# Patient Record
Sex: Female | Born: 1970
Health system: Southern US, Community
[De-identification: ages and names within clinical notes are randomized; demographics above are authoritative.]

## PROBLEM LIST (undated history)

## (undated) DIAGNOSIS — D219 Benign neoplasm of connective and other soft tissue, unspecified: Secondary | ICD-10-CM

## (undated) DIAGNOSIS — IMO0002 Reserved for concepts with insufficient information to code with codable children: Secondary | ICD-10-CM

## (undated) DIAGNOSIS — N946 Dysmenorrhea, unspecified: Secondary | ICD-10-CM

## (undated) DIAGNOSIS — N920 Excessive and frequent menstruation with regular cycle: Secondary | ICD-10-CM

## (undated) HISTORY — DX: Dysmenorrhea, unspecified: N94.6

## (undated) HISTORY — DX: Benign neoplasm of connective and other soft tissue, unspecified: D21.9

## (undated) HISTORY — DX: Reserved for concepts with insufficient information to code with codable children: IMO0002

## (undated) HISTORY — DX: Excessive and frequent menstruation with regular cycle: N92.0

---

## 2001-05-15 ENCOUNTER — Other Ambulatory Visit: Admission: RE | Admit: 2001-05-15 | Discharge: 2001-05-15 | Payer: Self-pay | Admitting: Obstetrics and Gynecology

## 2001-06-25 ENCOUNTER — Encounter: Admission: RE | Admit: 2001-06-25 | Discharge: 2001-06-25 | Payer: Self-pay | Admitting: Internal Medicine

## 2001-06-25 ENCOUNTER — Encounter: Payer: Self-pay | Admitting: Internal Medicine

## 2002-05-27 ENCOUNTER — Other Ambulatory Visit: Admission: RE | Admit: 2002-05-27 | Discharge: 2002-05-27 | Payer: Self-pay | Admitting: Obstetrics and Gynecology

## 2002-05-27 DIAGNOSIS — IMO0002 Reserved for concepts with insufficient information to code with codable children: Secondary | ICD-10-CM

## 2002-05-27 DIAGNOSIS — R87612 Low grade squamous intraepithelial lesion on cytologic smear of cervix (LGSIL): Secondary | ICD-10-CM

## 2002-05-27 HISTORY — DX: Low grade squamous intraepithelial lesion on cytologic smear of cervix (LGSIL): R87.612

## 2002-05-27 HISTORY — DX: Reserved for concepts with insufficient information to code with codable children: IMO0002

## 2002-11-22 ENCOUNTER — Other Ambulatory Visit: Admission: RE | Admit: 2002-11-22 | Discharge: 2002-11-22 | Payer: Self-pay | Admitting: Obstetrics and Gynecology

## 2003-05-30 ENCOUNTER — Other Ambulatory Visit: Admission: RE | Admit: 2003-05-30 | Discharge: 2003-05-30 | Payer: Self-pay | Admitting: Obstetrics and Gynecology

## 2003-08-19 ENCOUNTER — Other Ambulatory Visit: Admission: RE | Admit: 2003-08-19 | Discharge: 2003-08-19 | Payer: Self-pay | Admitting: Obstetrics and Gynecology

## 2003-12-16 ENCOUNTER — Other Ambulatory Visit: Admission: RE | Admit: 2003-12-16 | Discharge: 2003-12-16 | Payer: Self-pay | Admitting: Obstetrics and Gynecology

## 2005-02-10 ENCOUNTER — Other Ambulatory Visit: Admission: RE | Admit: 2005-02-10 | Discharge: 2005-02-10 | Payer: Self-pay | Admitting: Obstetrics and Gynecology

## 2006-02-13 ENCOUNTER — Other Ambulatory Visit: Admission: RE | Admit: 2006-02-13 | Discharge: 2006-02-13 | Payer: Self-pay | Admitting: Obstetrics and Gynecology

## 2006-07-11 ENCOUNTER — Other Ambulatory Visit: Admission: RE | Admit: 2006-07-11 | Discharge: 2006-07-11 | Payer: Self-pay | Admitting: Obstetrics and Gynecology

## 2007-04-12 ENCOUNTER — Other Ambulatory Visit: Admission: RE | Admit: 2007-04-12 | Discharge: 2007-04-12 | Payer: Self-pay | Admitting: Obstetrics and Gynecology

## 2008-01-18 HISTORY — PX: OTHER SURGICAL HISTORY: SHX169

## 2008-04-29 ENCOUNTER — Other Ambulatory Visit: Admission: RE | Admit: 2008-04-29 | Discharge: 2008-04-29 | Payer: Self-pay | Admitting: Obstetrics and Gynecology

## 2009-01-17 HISTORY — PX: COLPOSCOPY W/ BIOPSY / CURETTAGE: SUR283

## 2009-06-05 LAB — HM PAP SMEAR: HM Pap smear: NORMAL

## 2012-03-31 ENCOUNTER — Encounter: Payer: Self-pay | Admitting: Nurse Practitioner

## 2012-04-18 ENCOUNTER — Ambulatory Visit: Payer: Self-pay | Admitting: Nurse Practitioner

## 2012-04-25 ENCOUNTER — Other Ambulatory Visit: Payer: Self-pay | Admitting: Obstetrics & Gynecology

## 2012-04-25 ENCOUNTER — Other Ambulatory Visit: Payer: Self-pay | Admitting: Nurse Practitioner

## 2012-04-25 ENCOUNTER — Encounter: Payer: Self-pay | Admitting: Nurse Practitioner

## 2012-04-25 ENCOUNTER — Ambulatory Visit (INDEPENDENT_AMBULATORY_CARE_PROVIDER_SITE_OTHER): Payer: BC Managed Care – PPO | Admitting: Nurse Practitioner

## 2012-04-25 VITALS — BP 124/82 | HR 76 | Resp 16 | Ht 65.75 in | Wt 176.0 lb

## 2012-04-25 DIAGNOSIS — D219 Benign neoplasm of connective and other soft tissue, unspecified: Secondary | ICD-10-CM

## 2012-04-25 DIAGNOSIS — Z01419 Encounter for gynecological examination (general) (routine) without abnormal findings: Secondary | ICD-10-CM

## 2012-04-25 DIAGNOSIS — Z0000 Encounter for general adult medical examination without abnormal findings: Secondary | ICD-10-CM

## 2012-04-25 DIAGNOSIS — D62 Acute posthemorrhagic anemia: Secondary | ICD-10-CM

## 2012-04-25 DIAGNOSIS — N63 Unspecified lump in unspecified breast: Secondary | ICD-10-CM

## 2012-04-25 DIAGNOSIS — N6320 Unspecified lump in the left breast, unspecified quadrant: Secondary | ICD-10-CM

## 2012-04-25 DIAGNOSIS — D259 Leiomyoma of uterus, unspecified: Secondary | ICD-10-CM

## 2012-04-25 LAB — POCT URINALYSIS DIPSTICK
Bilirubin, UA: NEGATIVE
Blood, UA: NEGATIVE
Glucose, UA: NEGATIVE
Ketones, UA: NEGATIVE
Leukocytes, UA: NEGATIVE
Nitrite, UA: NEGATIVE
Protein, UA: NEGATIVE

## 2012-04-25 NOTE — Progress Notes (Signed)
42 y.o. G0P0000 Single African American Fe here for annual  Exam.    Menses usually heavy X 2 then light then spotting for 8 days.  No change in diet.  Same partner for 2 yrs. No STD risk factors. Condoms 'some times' for birth control. If pregnancy occurs Ok. History of UTE fibroids and had surgery 2010 by Dr. Hope Pigeon with H' scopic resection.  Patient's last menstrual period was 04/02/2012.          Sexually active: yes  The current method of family planning is condoms sometimes.    Exercising: no  The patient does not participate in regular exercise at present. Smoker:  no  Health Maintenance: Pap:  06/04/09 MMG:  n/a Colonoscopy: n/a BMD:   n/a TDaP:  2009 Labs: Hgb 10.6, urine Normal   reports that she has never smoked. She has never used smokeless tobacco. She reports that she does not drink alcohol or use illicit drugs.  Past Medical History  Diagnosis Date  . Fibroids   . Dysmenorrhea   . Menorrhagia   . Abnormal Pap smear, low grade squamous intraepithelial lesion (LGSIL) 05/27/2002    colpo bx. Chronic cervicitis  . Abnormal Pap smear, low grade squamous intraepithelial lesion (LGSIL) 11/22/2002    no colpo  . Abnormal Pap smear, low grade squamous intraepithelial lesion (LGSIL) 05/30/2003    no colpo    Past Surgical History  Procedure Laterality Date  . Colposcopy w/ biopsy / curettage  08/22/2002    Chroni cervicitis w/o  dysplasia    Current Outpatient Prescriptions  Medication Sig Dispense Refill  . fish oil-omega-3 fatty acids 1000 MG capsule Take 2 g by mouth daily.      . Multiple Vitamin (MULTIVITAMIN WITH MINERALS) TABS Take 1 tablet by mouth daily.      . naproxen sodium (ANAPROX) 220 MG tablet Take 220 mg by mouth as needed.       No current facility-administered medications for this visit.    Family History  Problem Relation Age of Onset  . Diabetes Mother   . Hypertension Mother   . Breast cancer Mother   . Breast cancer Maternal Aunt     late  30's  . Hypertension Father   . Cancer Maternal Grandmother     leukemia  . Heart failure Maternal Grandfather     ROS:  Pertinent items are noted in HPI.  Otherwise, a comprehensive ROS was negative.  Exam:   BP 124/82  Pulse 76  Resp 16  Ht 5' 5.75" (1.67 m)  Wt 176 lb (79.833 kg)  BMI 28.63 kg/m2  LMP 04/02/2012 Height: 5' 5.75" (167 cm)  Ht Readings from Last 3 Encounters:  04/25/12 5' 5.75" (1.67 m)    General appearance: alert, cooperative and appears stated age Head: Normocephalic, without obvious abnormality, atraumatic Neck: no adenopathy, supple, symmetrical, trachea midline and thyroid normal to inspection and palpation Lungs: clear to auscultation bilaterally Breasts: positive findings: 3 cm, smooth, firm, mobile and non-tender nodule located on the left upper outer quadrant at 10:00 position. Patient also rechecked by Dr. Hyacinth Meeker Heart: regular rate and rhythm Abdomen: soft, non-tender; bowel sounds normal; no masses,  no organomegaly Extremities: extremities normal, atraumatic, no cyanosis or edema Skin: Skin color, texture, turgor normal. No rashes or lesions Lymph nodes: Cervical, supraclavicular, and axillary nodes normal. No abnormal inguinal nodes palpated Neurologic: Grossly normal   Pelvic: External genitalia:  no lesions  Urethra:  normal appearing urethra with no masses, tenderness or lesions              Bartholin's and Skene's: normal                 Vagina: normal appearing vagina with normal color and discharge, no lesions              Cervix: anteverted              Pap taken: yes including HR HPV Bimanual Exam:  Uterus:  enlarged, 14-16  weeks size              Adnexa: positive for: enlargement and fullness               Rectovaginal: Confirms               Anus:  normal sphincter tone, no lesions  A:  Well Woman with normal exam  Mass Left Breast at 10:00 position  History of UTE fibroids with H' scopic resection done 2010  by  Dr. Hope Pigeon  Condoms for birth control  P:   Mammogram diagnostic and screening is scheduled  during  this visit  pap smear as per guidelines  Scheduled PUS and Dr. Hyacinth Meeker to see patient and follow  counseled on breast self exam, mammography screening,  adequate intake of calcium and vitamin D, diet and  exercise, Kegel's exercises  additional lab tests per Epic Care orders  return annually or prn  An After Visit Summary was printed and given to the patient.  I personally examined patient after Shirlyn Goltz, NP.  Diagnostic MMG of left breast planned.  Will also have patient complete routine imaging of right breast as well.  Scheduled for 04/30/12.

## 2012-04-25 NOTE — Patient Instructions (Addendum)

## 2012-04-26 ENCOUNTER — Other Ambulatory Visit: Payer: Self-pay | Admitting: Obstetrics & Gynecology

## 2012-04-26 DIAGNOSIS — D219 Benign neoplasm of connective and other soft tissue, unspecified: Secondary | ICD-10-CM

## 2012-04-27 ENCOUNTER — Telehealth: Payer: Self-pay | Admitting: *Deleted

## 2012-04-27 LAB — IPS PAP TEST WITH HPV

## 2012-04-27 LAB — HEMOGLOBIN, FINGERSTICK: Hemoglobin, fingerstick: 10.6 g/dL — ABNORMAL LOW (ref 12.0–16.0)

## 2012-04-27 NOTE — Telephone Encounter (Signed)
PATIENT NOT AT PHONE #'S LISTED IN CHART OR EPIC. LEFT MESSAGE WITH MOTHER AT HOME # OF APPOINTMENT DATE AND TIME WITH THE [facility]. MOTHER STATES WILL GIVE HER THE MESSAGE. SUE

## 2012-05-07 ENCOUNTER — Other Ambulatory Visit: Payer: Self-pay

## 2012-05-08 ENCOUNTER — Ambulatory Visit
Admission: RE | Admit: 2012-05-08 | Discharge: 2012-05-08 | Disposition: A | Discharge status: Home / Self Care | Payer: BC Managed Care – PPO | Source: Ambulatory Visit | Attending: Obstetrics & Gynecology | Admitting: Obstetrics & Gynecology

## 2012-05-08 DIAGNOSIS — N6320 Unspecified lump in the left breast, unspecified quadrant: Secondary | ICD-10-CM

## 2012-05-15 ENCOUNTER — Ambulatory Visit (INDEPENDENT_AMBULATORY_CARE_PROVIDER_SITE_OTHER): Payer: BC Managed Care – PPO

## 2012-05-15 ENCOUNTER — Ambulatory Visit (INDEPENDENT_AMBULATORY_CARE_PROVIDER_SITE_OTHER): Payer: BC Managed Care – PPO | Admitting: Obstetrics & Gynecology

## 2012-05-15 ENCOUNTER — Other Ambulatory Visit: Payer: Self-pay | Admitting: Obstetrics & Gynecology

## 2012-05-15 DIAGNOSIS — N859 Noninflammatory disorder of uterus, unspecified: Secondary | ICD-10-CM

## 2012-05-15 DIAGNOSIS — N858 Other specified noninflammatory disorders of uterus: Secondary | ICD-10-CM

## 2012-05-15 DIAGNOSIS — R188 Other ascites: Secondary | ICD-10-CM

## 2012-05-15 DIAGNOSIS — D259 Leiomyoma of uterus, unspecified: Secondary | ICD-10-CM

## 2012-05-15 DIAGNOSIS — N831 Corpus luteum cyst of ovary, unspecified side: Secondary | ICD-10-CM

## 2012-05-15 DIAGNOSIS — D219 Benign neoplasm of connective and other soft tissue, unspecified: Secondary | ICD-10-CM

## 2012-05-15 DIAGNOSIS — D251 Intramural leiomyoma of uterus: Secondary | ICD-10-CM

## 2012-05-15 DIAGNOSIS — D252 Subserosal leiomyoma of uterus: Secondary | ICD-10-CM

## 2012-05-15 DIAGNOSIS — N852 Hypertrophy of uterus: Secondary | ICD-10-CM

## 2012-05-15 DIAGNOSIS — D250 Submucous leiomyoma of uterus: Secondary | ICD-10-CM

## 2012-05-15 NOTE — Progress Notes (Signed)
42 y.o.Single AA female here for a pelvic ultrasound due to pelvic pressure and history of uterine fibroids/menorrhagia.  Patient's last menstrual period was 04/25/2012.  Sexually active:  yes  Contraception: no method  FINDINGS: UTERUS: 13.5 x 8.7 x 9.9cm.  Much enlarged from prior U/S.  Multiple fibroids noted: 4.9cm, 4.4cm, 3.6cm, 3.6cm, 3.5cm, 3.8cm (intramural, subserosal, and one pedunculated.) EMS: cavity is fluid filled containing probable 29mm submucosal fibroid.  Endometrial thickness if 5.34mm. ADNEXA:   Left ovary 3.0 x 3.2 x 2.5cm   Right ovary 2.9x 2.3 x 2.1cm CUL DE SAC:  D/W pt findings.  Images reviewed.  She is interested in child bearing and is in a good relationship right now.  Although she is not having bleeding issues, which she has had in the past, she would really like to proceed with hysteroscopic fibroid resection and possible myomectomy.  She has seen Dr. April Manson in the past and would like to see him again.  We will make referral for her.  All questions answered.    Assessment:  Large fibroid uterus Plan: Referral to Dr. April Manson for discussion of surgical options. PNV recommended.  ~25 minutes spent with patient >50% of time was in face to face discussion of above.

## 2012-05-17 ENCOUNTER — Encounter: Payer: Self-pay | Admitting: Obstetrics & Gynecology

## 2012-05-17 DIAGNOSIS — D259 Leiomyoma of uterus, unspecified: Secondary | ICD-10-CM | POA: Insufficient documentation

## 2012-05-17 DIAGNOSIS — D219 Benign neoplasm of connective and other soft tissue, unspecified: Secondary | ICD-10-CM | POA: Insufficient documentation

## 2012-05-17 NOTE — Patient Instructions (Signed)
We will call you with the appointment information for Dr. April Manson

## 2012-05-31 ENCOUNTER — Encounter: Payer: Self-pay | Admitting: Obstetrics & Gynecology

## 2012-05-31 ENCOUNTER — Ambulatory Visit (INDEPENDENT_AMBULATORY_CARE_PROVIDER_SITE_OTHER): Payer: BC Managed Care – PPO | Admitting: Obstetrics & Gynecology

## 2012-05-31 VITALS — BP 108/62 | HR 64 | Resp 12

## 2012-05-31 DIAGNOSIS — N6459 Other signs and symptoms in breast: Secondary | ICD-10-CM

## 2012-05-31 DIAGNOSIS — R922 Inconclusive mammogram: Secondary | ICD-10-CM

## 2012-05-31 NOTE — Progress Notes (Signed)
   Subjective:   42 y.o. SingleAfrican American female presents for evaluation of breasts.  Had palpable finding when here for u/s.  Went for screening MMG and ultrasound on left.  Negative findings.  Pt had LMP last week.  No new findings.  Breasts feel less "knotty" to her after cycle.      Objective:   General appearance: alert and cooperative Head: Normocephalic, without obvious abnormality, atraumatic Neck: no adenopathy, supple, symmetrical, trachea midline and thyroid not enlarged, symmetric, no tenderness/mass/nodules Breasts: normal appearance, no masses or tenderness, bilateral UOQ fibrocystic changes.  Much less nodular today.   Assessment:   ASSESSMENT:Patient is diagnosed with normal breast exam and breast density   Plan:   PLAN:  SBE encouraged.  Repeat MMG one year.  Patient does not have appt for AEX but knows due in about a year.

## 2013-01-01 HISTORY — PX: MYOMECTOMY ABDOMINAL APPROACH: SUR870

## 2013-03-11 ENCOUNTER — Encounter: Payer: Self-pay | Admitting: Nurse Practitioner

## 2013-03-11 ENCOUNTER — Ambulatory Visit (INDEPENDENT_AMBULATORY_CARE_PROVIDER_SITE_OTHER): Payer: BC Managed Care – PPO | Admitting: Nurse Practitioner

## 2013-03-11 VITALS — BP 131/97 | HR 83 | Ht 65.75 in | Wt 175.0 lb

## 2013-03-11 DIAGNOSIS — R350 Frequency of micturition: Secondary | ICD-10-CM

## 2013-03-11 LAB — POCT URINALYSIS DIPSTICK
BILIRUBIN UA: NEGATIVE
Blood, UA: NEGATIVE
GLUCOSE UA: NEGATIVE
Ketones, UA: NEGATIVE
LEUKOCYTES UA: NEGATIVE
NITRITE UA: NEGATIVE
Protein, UA: NEGATIVE
Urobilinogen, UA: NEGATIVE
pH, UA: 5

## 2013-03-11 NOTE — Progress Notes (Signed)
Subjective:     Patient ID: Emily Harris, female   DOB: Jun 27, 1970, 43 y.o.   MRN: [id number]  HPI This 43 yo SAA Fe complains of urinary or lower abdominal symptoms for the past few days. Some frequency but no dysuria.  She was concerned because she had a foley catheter after Myomectomy on December 16th.  She has now returned to work X 2 weeks and usual activities which includes wearing a belt and sometimes lifting.  No pain with SA.  No change in partner.  Using condoms for birth control.  Was to wait 3 months before trying for a pregnancy. No vgiinal discharge. LMP 02/17/13 which was normal. Last pm chilled feeling but thought it may be related to the weather.  She is having some fatigue but has only been back on her iron tablets for a week.  She was to return there end of December for a repeat HGB but she did not.  Mostly wants to make sure every thing looks and feels OK.   Review of Systems  Constitutional: Positive for chills and fatigue. Negative for fever.  Respiratory: Negative.   Cardiovascular: Negative.   Gastrointestinal: Negative.  Negative for nausea, vomiting, abdominal pain, diarrhea, constipation and blood in stool.  Genitourinary: Positive for frequency. Negative for dysuria, urgency, hematuria, flank pain, vaginal bleeding, vaginal discharge, vaginal pain, menstrual problem, pelvic pain and dyspareunia.  Musculoskeletal: Negative.   Skin: Negative.   Neurological: Negative.   Psychiatric/Behavioral: Negative.        Objective:   Physical Exam  Constitutional: She is oriented to person, place, and time. She appears well-developed and well-nourished. No distress.  Abdominal: Soft. She exhibits no distension and no mass. There is no tenderness. There is no rebound and no guarding.  Lower abdominal incision is well healed. No tenderness over the bladder.  No flank pain.  Genitourinary:  Normal vaginal discharge without any signs of infection. On bimanual no pain and so much  better without enlargement of the uterus. No cervical pain. Urine chemstrip is negative.  Neurological: She is alert and oriented to person, place, and time.  Psychiatric: She has a normal mood and affect. Her behavior is normal. Judgment and thought content normal.       Assessment:     History of Myomectomy with removal of 15 fibroids 01/01/13 History of anemia - back on Fe X 1 week Urinary frequency - R/O UTI    Plan:     No treatment indicated at this time.  Will send urine for C & S and follow. She is aware if symptoms worsen to call back. She will have HGB recheck in 1 month here ar at infertility clinic

## 2013-03-11 NOTE — Patient Instructions (Signed)
We will call with urine culture results Get repeat HGB in a month

## 2013-03-12 LAB — URINE CULTURE
COLONY COUNT: NO GROWTH
ORGANISM ID, BACTERIA: NO GROWTH

## 2013-03-14 NOTE — Progress Notes (Signed)
Encounter reviewed by Dr. Brook Silva.  

## 2013-03-18 ENCOUNTER — Telehealth: Payer: Self-pay | Admitting: *Deleted

## 2013-03-18 NOTE — Telephone Encounter (Signed)
Patient is calling Stephanie back

## 2013-03-18 NOTE — Telephone Encounter (Signed)
I have attempted to contact this patient by phone with the following results: left message to return my call on answering machine (home/mobile).  

## 2013-03-18 NOTE — Telephone Encounter (Signed)
Pt notified in result note.  

## 2013-03-18 NOTE — Telephone Encounter (Signed)
Message copied by Graylon Good on Mon Mar 18, 2013 11:11 AM ------      Message from: Regina Eck      Created: Wed Mar 13, 2013 10:19 PM       Notify urine culture negative, no treatment indicated ------

## 2013-05-02 ENCOUNTER — Other Ambulatory Visit: Payer: Self-pay

## 2013-05-02 DIAGNOSIS — Z1231 Encounter for screening mammogram for malignant neoplasm of breast: Secondary | ICD-10-CM

## 2013-05-21 ENCOUNTER — Ambulatory Visit
Admission: RE | Admit: 2013-05-21 | Discharge: 2013-05-21 | Disposition: A | Discharge status: Home / Self Care | Payer: BC Managed Care – PPO | Source: Ambulatory Visit

## 2013-05-21 DIAGNOSIS — Z1231 Encounter for screening mammogram for malignant neoplasm of breast: Secondary | ICD-10-CM

## 2013-10-28 ENCOUNTER — Encounter: Payer: Self-pay | Admitting: Nurse Practitioner

## 2013-10-28 ENCOUNTER — Ambulatory Visit (INDEPENDENT_AMBULATORY_CARE_PROVIDER_SITE_OTHER): Payer: BC Managed Care – PPO | Admitting: Nurse Practitioner

## 2013-10-28 VITALS — BP 122/82 | HR 72 | Ht 65.75 in | Wt 177.0 lb

## 2013-10-28 DIAGNOSIS — Z0000 Encounter for general adult medical examination without abnormal findings: Secondary | ICD-10-CM

## 2013-10-28 DIAGNOSIS — Z01419 Encounter for gynecological examination (general) (routine) without abnormal findings: Secondary | ICD-10-CM

## 2013-10-28 LAB — POCT URINALYSIS DIPSTICK
Bilirubin, UA: NEGATIVE
Blood, UA: NEGATIVE
Glucose, UA: NEGATIVE
Ketones, UA: NEGATIVE
LEUKOCYTES UA: NEGATIVE
NITRITE UA: NEGATIVE
PH UA: 7
PROTEIN UA: NEGATIVE
UROBILINOGEN UA: NEGATIVE

## 2013-10-28 LAB — HEMOGLOBIN, FINGERSTICK: Hemoglobin, fingerstick: 13.3 g/dL (ref 12.0–16.0)

## 2013-10-28 NOTE — Patient Instructions (Signed)

## 2013-10-28 NOTE — Progress Notes (Signed)
43 y.o. G0P0 Single African American Fe here for annual exam.  Normal menses last for 7 days, flow for 3 days and spotting afterwards.  Ute fibroids removed 2014.  A maternal 1st cousin diagnosed with breast cancer age 65. She was BRCA negative.   Patient's last menstrual period was 10/08/2013.          Sexually active: No.  The current method of family planning is none.    Exercising: Yes.    Home exercise routine includes walking 2-4 miles a week. Smoker:  no  Health Maintenance: Pap:  04/25/12 neg HR HPV MMG:  05/21/13 Bi-Rads Neg TDaP:  2009 Labs: Hgb: 13.3  Urine : negative   reports that she has never smoked. She has never used smokeless tobacco. She reports that she drinks alcohol. She reports that she does not use illicit drugs.  Past Medical History  Diagnosis Date  . Fibroids   . Dysmenorrhea   . Menorrhagia   . Abnormal Pap smear, low grade squamous intraepithelial lesion (LGSIL) 05/27/2002    colpo bx. Chronic cervicitis  . Abnormal Pap smear, low grade squamous intraepithelial lesion (LGSIL) 11/22/2002    no colpo  . Abnormal Pap smear, low grade squamous intraepithelial lesion (LGSIL) 05/30/2003    no colpo    Past Surgical History  Procedure Laterality Date  . Colposcopy w/ biopsy / curettage  08/22/2002    Chroni cervicitis w/o  dysplasia  . Myomectomy abdominal approach  01/01/13    [facility]    Current Outpatient Prescriptions  Medication Sig Dispense Refill  . ferrous sulfate 325 (65 FE) MG tablet Take 325 mg by mouth daily with breakfast.      . fish oil-omega-3 fatty acids 1000 MG capsule Take 2 g by mouth daily.      . Multiple Vitamin (MULTIVITAMIN WITH MINERALS) TABS Take 1 tablet by mouth daily.      . naproxen sodium (ANAPROX) 220 MG tablet Take 220 mg by mouth as needed.       No current facility-administered medications for this visit.    Family History  Problem Relation Age of Onset  . Diabetes Mother   . Hypertension Mother   .  Breast cancer Mother   . Breast cancer Maternal Aunt     late 30's  . Hypertension Father   . Cancer Maternal Grandmother     leukemia  . Heart failure Maternal Grandfather   . Breast cancer Cousin 44    07/2013    ROS:  Pertinent items are noted in HPI.  Otherwise, a comprehensive ROS was negative.  Exam:   BP 122/82  Pulse 72  Ht 5' 5.75" (1.67 m)  Wt 177 lb (80.287 kg)  BMI 28.79 kg/m2  LMP 10/08/2013 Height: 5' 5.75" (167 cm)  Ht Readings from Last 3 Encounters:  10/28/13 5' 5.75" (1.67 m)  03/11/13 5' 5.75" (1.67 m)  04/25/12 5' 5.75" (1.67 m)    General appearance: alert, cooperative and appears stated age Head: Normocephalic, without obvious abnormality, atraumatic Neck: no adenopathy, supple, symmetrical, trachea midline and thyroid normal to inspection and palpation Lungs: clear to auscultation bilaterally Breasts: normal appearance, no masses or tenderness Heart: regular rate and rhythm Abdomen: soft, non-tender; no masses,  no organomegaly Extremities: extremities normal, atraumatic, no cyanosis or edema Skin: Skin color, texture, turgor normal. No rashes or lesions Lymph nodes: Cervical, supraclavicular, and axillary nodes normal. No abnormal inguinal nodes palpated Neurologic: Grossly normal   Pelvic: External genitalia:  no lesions              Urethra:  normal appearing urethra with no masses, tenderness or lesions              Bartholin's and Skene's: normal                 Vagina: normal appearing vagina with normal color and discharge, no lesions              Cervix: anteverted              Pap taken: Yes.   Bimanual Exam:  Uterus:  normal size, contour, position, consistency, mobility, non-tender              Adnexa: no mass, fullness, tenderness               Rectovaginal: Confirms               Anus:  normal sphincter tone, no lesions  A:  Well Woman with normal exam  History of FCB  History of UTE fibroids with H' scopic resection done 2010 by  Dr. Jules Husbands   Condoms for birth control if SA  Maternal 1st cousin with breast cancer age 12, mother breast cancer ? 50's  History of anemia  BRCA negative  Remote history of CIN I 08/2002  P:   Reviewed health and wellness pertinent to exam  Pap smear taken today  Mammogram is due 5/16  Continue FE.  Counseled on breast self exam, mammography screening, adequate intake of calcium and vitamin D, diet and exercise, Kegel's exercises return annually or prn  An After Visit Summary was printed and given to the patient.

## 2013-10-29 NOTE — Progress Notes (Signed)
Encounter reviewed by Dr. Brook Silva.  

## 2013-10-30 LAB — IPS PAP TEST WITH HPV

## 2013-11-07 ENCOUNTER — Telehealth: Payer: Self-pay | Admitting: Emergency Medicine

## 2013-11-07 DIAGNOSIS — R87612 Low grade squamous intraepithelial lesion on cytologic smear of cervix (LGSIL): Secondary | ICD-10-CM

## 2013-11-07 NOTE — Telephone Encounter (Signed)
PR: $30 ° ° °

## 2013-11-07 NOTE — Telephone Encounter (Signed)
Message copied by Michele Mcalpine on Thu Nov 07, 2013  8:51 AM ------      Message from: Kem Boroughs R      Created: Wed Oct 30, 2013  8:36 PM       This patient needs to have a colpo biopsy - previously done in 2004, 2005 ------

## 2013-11-07 NOTE — Telephone Encounter (Signed)
Call to patient. Advised of $30 copay due at colpo visit. Patient agreeable.

## 2013-11-07 NOTE — Telephone Encounter (Signed)
Patient notified of message from Milford Cage, [place].  She is agreeable to scheduling colposcopy. Brief description of procedure given to patient.  Colposcopy pre-procedure instructions given. Advised 800 mg of Motrin with food one hour prior to appointment. Motrin=Advil=Ibuprofen Can take 800 mg (Can purchase over the counter, you will need four 200 mg pills). Make sure to eat a meal before appointment and drink plenty of fluids. Patient verbalized understanding and will call to reschedule if will be on menses or has any concerns regarding pregnancy. Advised will need to cancel within 24 hours or will have $100.00 no show fee placed to account.  Type of birth control-condoms LMP 11/01/13  Patient anxious to schedule. Would like to complete after this cycle, if possible.  Ordered colposcopy, prior colposcopy not done at [facility].   Advised patient would be called with  insurance pre certification. Patient agreeable.   Routing to Dr. Sabra Heck.          [place] for insurance pre certification and link order.

## 2013-11-11 ENCOUNTER — Ambulatory Visit (INDEPENDENT_AMBULATORY_CARE_PROVIDER_SITE_OTHER): Payer: BC Managed Care – PPO | Admitting: Obstetrics & Gynecology

## 2013-11-11 VITALS — BP 124/78 | HR 72 | Resp 16 | Wt 178.6 lb

## 2013-11-11 DIAGNOSIS — R87612 Low grade squamous intraepithelial lesion on cytologic smear of cervix (LGSIL): Secondary | ICD-10-CM

## 2013-11-11 NOTE — Progress Notes (Deleted)
Speculum placed.  3% acetic acid applied to cervix for >45 seconds.  Cervix visualized with both 7.5X and 15X magnification.  Green filter also used.  Lugols solution {WAS/WAS NOT:2100118327::"was not"} used.  Findings:  ***.  Biopsy:  ***.  ECC:  {WAS/WAS NOT:2100118327::"was not"} performed.  Monsel's {WAS/WAS NOT:2100118327::"was not"} needed.  Excellent hemostasis was present.  Pt tolerated procedure well and all instruments were removed.  Findings noted above on picture of cervix.  

## 2013-11-11 NOTE — Progress Notes (Addendum)
Subjective:     Patient ID: Emily Harris, female   DOB: 10-11-1970, 43 y.o.   MRN: [id number]  HPI 43 yo G0 SAAF here for colposcopy due to LGSIL pap with +HR HPV.  Pt had normal pap with neg HR HPV 1 year ago.  Pt understands HPV as had this in the past.  Testing did normalize.  All questions answered.    Review of Systems  All other systems reviewed and are negative.      Objective:   Physical Exam  Constitutional: She appears well-developed and well-nourished.  Genitourinary: Vagina normal. There is no rash, tenderness or lesion on the right labia. There is no rash, tenderness or lesion on the left labia.    Skin: Skin is warm and dry.  Psychiatric: She has a normal mood and affect.   Speculum placed.  3% acetic acid applied to cervix for >45 seconds.  Cervix visualized with both 7.5X and 15X magnification.  Green filter also used.  Lugols solution was used.  Findings:  No AWE noted.  No abnormal staining with Lugol's.  Biopsy:  None taken.  ECC:  was performed.  Monsel's was not needed.  Excellent hemostasis was present.  Pt tolerated procedure well and all instruments were removed.  Findings noted above on picture of cervix.     Assessment:     LGSIL pap with +HR HPV     Plan:     ECC pending.  If normal, repeat Pap and HR HPV 1 year.

## 2013-11-14 LAB — IPS OTHER TISSUE BIOPSY

## 2013-11-19 ENCOUNTER — Telehealth: Payer: Self-pay

## 2013-11-19 NOTE — Telephone Encounter (Signed)
-----   Message from Lyman Speller, MD sent at 11/14/2013 12:32 PM EDT ----- Inform ECC showed HPV changes only.  No dysplasia.  Repeat pap with HR HPV 1 year.  08 recall.

## 2013-11-19 NOTE — Telephone Encounter (Signed)
Spoke with patient. Advised of results as seen below from Dr.Miller. Patient is agreeable and verbalizes understanding. 08 recall entered.  Routing to provider for final review. Patient agreeable to disposition. Will close encounter.      

## 2014-03-24 ENCOUNTER — Ambulatory Visit (INDEPENDENT_AMBULATORY_CARE_PROVIDER_SITE_OTHER): Payer: BLUE CROSS/BLUE SHIELD | Admitting: Family Medicine

## 2014-03-24 VITALS — BP 120/90 | HR 79 | Temp 97.5°F | Resp 16 | Ht 65.5 in | Wt 183.0 lb

## 2014-03-24 DIAGNOSIS — J320 Chronic maxillary sinusitis: Secondary | ICD-10-CM

## 2014-03-24 DIAGNOSIS — R05 Cough: Secondary | ICD-10-CM

## 2014-03-24 DIAGNOSIS — J069 Acute upper respiratory infection, unspecified: Secondary | ICD-10-CM

## 2014-03-24 DIAGNOSIS — R059 Cough, unspecified: Secondary | ICD-10-CM

## 2014-03-24 MED ORDER — BENZONATATE 100 MG PO CAPS: 100.0000 mg | ORAL_CAPSULE | Freq: Three times a day (TID) | ORAL | Status: DC | PRN

## 2014-03-24 MED ORDER — FLUTICASONE PROPIONATE 50 MCG/ACT NA SUSP: NASAL | Status: DC

## 2014-03-24 MED ORDER — AMOXICILLIN 875 MG PO TABS: 875.0000 mg | ORAL_TABLET | Freq: Two times a day (BID) | ORAL | Status: DC

## 2014-03-24 NOTE — Progress Notes (Signed)
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 She has had a respiratory tract infection for the past 8 days. She's not been running a definite fever, though may have had one 1 night. She has had a lot of head congestion. She is blowing out deep yellow mucus. She has minimal throat irritation. No ear problems. No chest pains but she does have a cough. She does not smoke. She has no children. She has tried treating herself with some OTC medications without a lot of relief.  Objective: TMs are normal. Mild tenderness over maxillary sinuses. Throat clear. Neck supple without significant nodes. Chest is clear to auscultation. Heart regular without murmurs.  Assessment: Upper respiratory infection with prolonged sinus congestion  Plan: Fluticasone Amoxicillin  Return if not improving. No testing done today.

## 2014-03-24 NOTE — Patient Instructions (Addendum)
Drink plenty of fluids. The better hydrated you stay the thinner the secretions.  Make sure you get enough rest. That helps the body's immune system be things off.  Take amoxicillin one twice daily for infection  Use the fluticasone nose spray 2 sprays in each nostril twice daily for 3 days, then once daily  You can continue taking an over-the-counter decongestant.  Take the cough pills one or 2 pills 3 times daily as needed for cough  Return if not improving

## 2014-04-09 ENCOUNTER — Ambulatory Visit (INDEPENDENT_AMBULATORY_CARE_PROVIDER_SITE_OTHER): Payer: BLUE CROSS/BLUE SHIELD | Admitting: Family Medicine

## 2014-04-09 VITALS — BP 132/78 | HR 81 | Temp 98.2°F | Resp 18 | Ht 65.0 in | Wt 185.0 lb

## 2014-04-09 DIAGNOSIS — J069 Acute upper respiratory infection, unspecified: Secondary | ICD-10-CM

## 2014-04-09 DIAGNOSIS — R05 Cough: Secondary | ICD-10-CM

## 2014-04-09 DIAGNOSIS — R059 Cough, unspecified: Secondary | ICD-10-CM

## 2014-04-09 MED ORDER — HYDROCODONE-HOMATROPINE 5-1.5 MG/5ML PO SYRP
5.0000 mL | ORAL_SOLUTION | Freq: Every evening | ORAL | Status: DC | PRN
Start: 2014-04-09 — End: 2014-05-20

## 2014-04-09 MED ORDER — AZITHROMYCIN 250 MG PO TABS: ORAL_TABLET | ORAL | Status: DC

## 2014-04-09 MED ORDER — BENZONATATE 100 MG PO CAPS: 100.0000 mg | ORAL_CAPSULE | Freq: Three times a day (TID) | ORAL | Status: DC | PRN

## 2014-04-09 NOTE — Progress Notes (Signed)
Chief Complaint:  Chief Complaint  Patient presents with  . Follow-up    still having cough and congestion    HPI: Emily Harris is a 44 y.o. female who is here for  Acute respiratory sxs. Since feb cough  And congestion, has to stay up , mucus. She denies allergies.  She states that her symptoms have been present since the end of February. She was seen for an upper respiratory infection and was given amoxicillin, Tessalon Perles and advised to take Flonase. She took the antibiotics and it did help the symptoms. However its it has returned. She did not take the Flonase except for one time. She denies any allergies, asthma symptoms. 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 She has a dry, occasional productive white mucus cough, the cough keeps her up at night and she has to sit up to feel less congestion says she can actually sleep. She denies any congestive heart failure, weight gain or lower extremity edema. Denies fevers or chills.  Past Medical History  Diagnosis Date  . Fibroids   . Dysmenorrhea   . Menorrhagia   . Abnormal Pap smear, low grade squamous intraepithelial lesion (LGSIL) 05/27/2002    colpo bx. Chronic cervicitis  . Abnormal Pap smear, low grade squamous intraepithelial lesion (LGSIL) 11/22/2002    no colpo  . Abnormal Pap smear, low grade squamous intraepithelial lesion (LGSIL) 05/30/2003    no colpo  . Anemia    Past Surgical History  Procedure Laterality Date  . Colposcopy w/ biopsy / curettage  08/22/2002    Chroni cervicitis w/o  dysplasia  . Myomectomy abdominal approach  01/01/13    [facility]   History   Social History  . Marital Status: Single    Spouse Name: N/A  . Number of Children: N/A  . Years of Education: 16   Occupational History  . medical processor    Social History Main Topics  . Smoking status: Never Smoker   . Smokeless tobacco: Never Used  . Alcohol Use: Yes   Comment: socially  . Drug Use: No  . Sexual Activity: No   Other Topics Concern  . None   Social History Narrative   Family History  Problem Relation Age of Onset  . Diabetes Mother   . Hypertension Mother   . Breast cancer Mother   . Cancer Mother   . Breast cancer Maternal Aunt     late 30's  . Hypertension Father   . Cancer Maternal Grandmother     leukemia  . Diabetes Maternal Grandmother   . Heart failure Maternal Grandfather   . Heart disease Maternal Grandfather   . Breast cancer Cousin 44    07/2013  . Heart disease Cousin   . Diabetes Paternal Grandmother    No Known Allergies Prior to Admission medications   Medication Sig Start Date End Date Taking? Authorizing Provider  ferrous sulfate 325 (65 FE) MG tablet Take 325 mg by mouth daily with breakfast.   Yes Historical Provider, MD  fish oil-omega-3 fatty acids 1000 MG capsule Take 2 g by mouth daily.   Yes Historical Provider, MD  fluticasone (FLONASE) 50 MCG/ACT nasal spray Use 2 sprays each nostril twice daily for 3 days, then once daily 03/24/14  Yes Posey Boyer, MD  Multiple Vitamin (MULTIVITAMIN WITH MINERALS) TABS Take 1 tablet by mouth daily.   Yes Historical Provider, MD  naproxen sodium (ANAPROX) 220 MG tablet Take 220 mg by mouth as needed.   Yes Historical Provider, MD  amoxicillin (AMOXIL) 875 MG tablet Take 1 tablet (875 mg total) by mouth 2 (two) times daily. Patient not taking: Reported on 04/09/2014 03/24/14   Posey Boyer, MD  benzonatate (TESSALON) 100 MG capsule Take 1-2 capsules (100-200 mg total) by mouth 3 (three) times daily as needed. Patient not taking: Reported on 04/09/2014 03/24/14   Posey Boyer, MD     ROS: The patient denies fevers, chills, night sweats, unintentional weight loss, chest pain, palpitations, wheezing, dyspnea on exertion, nausea, vomiting, abdominal pain, dysuria, hematuria, melena, numbness, weakness, or tingling.   All other systems have been reviewed and were  otherwise negative with the exception of those mentioned in the HPI and as above.    PHYSICAL EXAM: Filed Vitals:   04/09/14 1654  BP: 132/78  Pulse: 81  Temp: 98.2 F (36.8 C)  Resp: 18   Filed Vitals:   04/09/14 1654  Height: 5\' 5"  (1.651 m)  Weight: 185 lb (83.915 kg)   Body mass index is 30.79 kg/(m^2).  General: Alert, no acute distress HEENT:  Normocephalic, atraumatic, oropharynx patent. EOMI, PERRLA Erythematous throat, no exudates, TM normal, +/- sinus tenderness, + erythematous/boggy nasal mucosa Cardiovascular:  Regular rate and rhythm, no rubs murmurs or gallops.  No Carotid bruits, radial pulse intact. No pedal edema.  Respiratory: Clear to auscultation bilaterally.  No wheezes, rales, or rhonchi.  No cyanosis, no use of accessory musculature GI: No organomegaly, abdomen is soft and non-tender, positive bowel sounds.  No masses. Skin: No rashes. Neurologic: Facial musculature symmetric. Psychiatric: Patient is appropriate throughout our interaction. Lymphatic: No cervical lymphadenopathy Musculoskeletal: Gait intact.   LABS: Results for orders placed or performed in visit on 11/11/13  Tissue Biopsy  Result Value Ref Range   COMMENTS: [facility]      EKG/XRAY:   Primary read interpreted by Dr. Marin Comment at [facility].   ASSESSMENT/PLAN: Encounter Diagnoses  Name Primary?  . Cough Yes  . Acute upper respiratory infection    I discussed with the patient this is probably related to allergic rhinitis, seasonal allergies. She is very resistant to the idea that this could be allergies. Recommended that she try over-the-counter Nasacort, antihistamine such as Zyrtec first. She can also take the [facility], Hycodan when necessary If the symptoms are not relieved and she can try the azithromycin. Discussed the risk and benefits his abusing antibiotics. Follow-up as needed.  Gross sideeffects, risk and benefits, and alternatives of medications d/w  patient. Patient is aware that all medications have potential sideeffects and we are unable to predict every sideeffect or drug-drug interaction that may occur.  LE, [place], DO 04/09/2014 5:21 PM

## 2014-04-09 NOTE — Patient Instructions (Signed)
Try nasacort nasal spray once a day in the morning Try zyrtec over the counter daily IF no improvement then take the antibiotic  Cough medicines as needed

## 2014-04-21 ENCOUNTER — Other Ambulatory Visit: Payer: Self-pay

## 2014-04-21 DIAGNOSIS — Z1231 Encounter for screening mammogram for malignant neoplasm of breast: Secondary | ICD-10-CM

## 2014-05-16 ENCOUNTER — Ambulatory Visit: Payer: BC Managed Care – PPO | Admitting: Obstetrics & Gynecology

## 2014-05-20 ENCOUNTER — Ambulatory Visit (INDEPENDENT_AMBULATORY_CARE_PROVIDER_SITE_OTHER): Payer: BLUE CROSS/BLUE SHIELD | Admitting: Obstetrics & Gynecology

## 2014-05-20 VITALS — BP 124/80 | HR 60 | Resp 16 | Wt 182.4 lb

## 2014-05-20 DIAGNOSIS — R896 Abnormal cytological findings in specimens from other organs, systems and tissues: Secondary | ICD-10-CM

## 2014-05-20 DIAGNOSIS — IMO0002 Reserved for concepts with insufficient information to code with codable children: Secondary | ICD-10-CM

## 2014-05-20 NOTE — Progress Notes (Signed)
Subjective:     Patient ID: Emily Harris, female   DOB: 09-03-1970, 44 y.o.   MRN: [id number]  HPI 44 yo G0 SAAF here for repeat Pap due to h/o LGSIL and + HR HPV.  Pt's first abnormal pap was in 2014 with neg pap but positive HR HPV.  Pt has prior hx of HPV infection as well.  Still, has several questions today.  These are similar to what we discussed when she had her colposcopy 11/11/13.  Addressed all questions today before exam.    Pt aware, per guidelines, it is ok to skip this Pap and repeat at one year mark.  Anxious about waiting that long so wants to proceed today as we discussed at colposcopy.  Denies any new gyn complaints--no irregular bleeding, vaginal discharge, pelvic pain.  Review of Systems  All other systems reviewed and are negative.      Objective:   Physical Exam  Constitutional: She appears well-developed and well-nourished.  Genitourinary: Vagina normal and uterus normal. There is no rash, tenderness, lesion or injury on the right labia. There is no rash, tenderness, lesion or injury on the left labia. Cervix exhibits no motion tenderness and no discharge. No tenderness or bleeding in the vagina. No vaginal discharge found.  Pap only obtained today.  Lymphadenopathy:       Right: No inguinal adenopathy present.       Left: No inguinal adenopathy present.  Vitals reviewed.      Assessment:     H/O LGSIL pap, s/p colposcopy 10/15     Plan:     Pap only obtained today. HPV questoins answered.   ~15 minutes spent with patient >50% of time was in face to face discussion of HPV, cervical cancer risk, HPV suppression and additional HPV related questions.

## 2014-05-22 LAB — IPS PAP SMEAR ONLY

## 2014-05-23 ENCOUNTER — Ambulatory Visit: Payer: Self-pay

## 2014-05-25 ENCOUNTER — Encounter: Payer: Self-pay | Admitting: Obstetrics & Gynecology

## 2014-05-25 DIAGNOSIS — IMO0002 Reserved for concepts with insufficient information to code with codable children: Secondary | ICD-10-CM | POA: Insufficient documentation

## 2014-05-26 ENCOUNTER — Telehealth: Payer: Self-pay | Admitting: Emergency Medicine

## 2014-05-26 ENCOUNTER — Ambulatory Visit
Admission: RE | Admit: 2014-05-26 | Discharge: 2014-05-26 | Disposition: A | Discharge status: Home / Self Care | Payer: BLUE CROSS/BLUE SHIELD | Source: Ambulatory Visit

## 2014-05-26 DIAGNOSIS — R87613 High grade squamous intraepithelial lesion on cytologic smear of cervix (HGSIL): Secondary | ICD-10-CM

## 2014-05-26 DIAGNOSIS — Z1231 Encounter for screening mammogram for malignant neoplasm of breast: Secondary | ICD-10-CM

## 2014-05-26 NOTE — Telephone Encounter (Signed)
-----   Message from Megan Salon, MD sent at 05/23/2014  8:09 AM EDT ----- Inform pt Pap showed HGSIL, so as I informed her when she was here this week, she does need a repeat colposcopy.

## 2014-05-26 NOTE — Addendum Note (Signed)
Addended by: Michele Mcalpine on: 05/26/2014 04:36 PM   Modules accepted: Orders

## 2014-05-26 NOTE — Telephone Encounter (Addendum)
Spoke with patient. Advised pap smear continues to show abnormal cells and colposcopy recommended per Dr. Sabra Heck. Patient agreeable to this. She remembers procedure from last year, has no questions.   LMP 05/24/14, 7 days menses. Patient scheduled for procedure 06/05/14 (cycle day 13) patient states she is not currently sexually active. Advised will schedule for this date and request Dr. Sabra Heck review and will call back if needs to reschedule procedure appointment. Patient agreeable.   Colposcopy pre-procedure instructions given. Make sure to eat a meal and hydrate before appointment.  Advised 800 mg of Motrin with food one hour prior to appointment. Motrin/Advil or Ibuprofen. Take 800 mg (Can purchase over the counter, you will need four 200 mg pills). Patient states she prefers to take Aleve, advised to follow directions on the bottle of her pills she keeps at home.  Advised will need to cancel or reschedule within 72 hours or will have $150.00 no show fee placed to account.   Patient verbalized understanding of preprocedure instructions and cancellation policy and will call to reschedule if will be on menses or has any concerns regarding pregnancy.  Patient is advised she will be contacted with insurance coverage information.           [place] for insurance pre certification.    Dr. Sabra Heck okay as scheduled?  (Encounter closed in erroneously prior to sending message)

## 2014-05-27 NOTE — Telephone Encounter (Signed)
Pre-cert complete

## 2014-06-05 ENCOUNTER — Ambulatory Visit (INDEPENDENT_AMBULATORY_CARE_PROVIDER_SITE_OTHER): Payer: BLUE CROSS/BLUE SHIELD | Admitting: Obstetrics & Gynecology

## 2014-06-05 ENCOUNTER — Encounter: Payer: Self-pay | Admitting: Obstetrics & Gynecology

## 2014-06-05 DIAGNOSIS — R87613 High grade squamous intraepithelial lesion on cytologic smear of cervix (HGSIL): Secondary | ICD-10-CM

## 2014-06-05 NOTE — Progress Notes (Signed)
44 y.o. Single African American female here for colposcopy with possible biopsies and/or ECC due to HGSIL Pap obtained at [facility] 05/20/14.    Patient's last menstrual period was 05/23/2014 (exact date).          Sexually active: No.  The current method of family planning is none.     Patient has been counseled about results and procedure.  Risks and benefits have bene reviewed including immediate and/or delayed bleeding, infection, cervical scaring from procedure, possibility of needing additional follow up as well as treatment.  rare risks of missing a lesion discussed as well.  All questions answered.  Pt ready to proceed.  BP 132/82 mmHg  Pulse 72  Resp 16  Wt 182 lb 3.2 oz (82.645 kg)  LMP 05/23/2014 (Exact Date)  General appearance: alert, cooperative and appears stated age Head: Normocephalic, without obvious abnormality, atraumatic Neurologic: Grossly normal  Pelvic: External genitalia:  no lesions              Urethra:  normal appearing urethra with no masses, tenderness or lesions              Bartholins and Skenes: normal                 Vagina: normal appearing vagina with normal color and discharge, no lesions              Cervix: no lesions              Pap taken: No.  Speculum placed.  3% acetic acid applied to cervix for >45 seconds.  Cervix visualized with both 7.5X and 15X magnification.  Green filter also used.  Lugols solution was used.  Findings:  Tiny area of decreased staining at 4 o'clock.  No AWE, no mosaicism.   Cervix looks very normal.  Endocervical TZ noted.  Biopsy:  4 o'clock.  ECC:  was performed.  Monsel's was needed.  Excellent hemostasis was present.  Pt tolerated procedure well and all instruments were removed.    Assessment:  HGSIL pap  Plan:  Pathology results will be called to patient and follow-up planned pending results.

## 2014-06-10 NOTE — Addendum Note (Signed)
Addended by: Megan Salon on: 06/10/2014 06:07 AM   Modules accepted: Orders

## 2014-06-12 LAB — IPS OTHER TISSUE BIOPSY

## 2014-06-25 ENCOUNTER — Telehealth: Payer: Self-pay | Admitting: Emergency Medicine

## 2014-06-25 DIAGNOSIS — N879 Dysplasia of cervix uteri, unspecified: Secondary | ICD-10-CM

## 2014-06-25 NOTE — Telephone Encounter (Signed)
Message left to return call to Roben Tatsch at [phone].    

## 2014-06-25 NOTE — Telephone Encounter (Signed)
-----   Message from Megan Salon, MD sent at 06/24/2014 12:49 PM EDT ----- Please inform biopsy showed HPV changes but ECC showed dysplasia.  LEEP recommended.  CC:  Maryann Conners

## 2014-06-25 NOTE — Telephone Encounter (Signed)
Noted message as below. Will close encounter.

## 2014-06-25 NOTE — Telephone Encounter (Signed)
Incoming call from patient.   Patient notified of message from Dr. Sabra Heck.  She is agreeable to scheduling LEEP. Brief description of procedure given to patient.  LEEP pre-procedure instructions given. Advised 800 mg of Motrin with food one hour prior to appointment. Motrin=Advil=Ibuprofen Can take 800 mg (Can purchase over the counter, you will need four 200 mg pills). Patient prefers Aleve and will take that.  Make sure to eat a meal before appointment and drink plenty of fluids. Patient verbalized understanding and will call to reschedule if will be on menses or has any concerns regarding pregnancy. Advised will need to cancel within 24 hours or will have $100.00 no show fee placed to account.   LMP 6/2 or 6/3 per patient. Patient states she is not sexually active at all.   Dr. Sabra Heck, okay for LEEP scheduled for day possible cycle day 12. Patient confirms she is not sexually active.   cc Felipa Emory

## 2014-06-25 NOTE — Telephone Encounter (Signed)
Yes.  Pt has also told me the same thing regarding sexual activity so ok to schedule whenever it works for her schedule.  Thanks.

## 2014-06-30 ENCOUNTER — Encounter: Payer: Self-pay | Admitting: Obstetrics & Gynecology

## 2014-06-30 ENCOUNTER — Ambulatory Visit (INDEPENDENT_AMBULATORY_CARE_PROVIDER_SITE_OTHER): Payer: BLUE CROSS/BLUE SHIELD | Admitting: Obstetrics & Gynecology

## 2014-06-30 VITALS — BP 122/84 | HR 76 | Ht 65.75 in | Wt 182.4 lb

## 2014-06-30 DIAGNOSIS — R896 Abnormal cytological findings in specimens from other organs, systems and tissues: Secondary | ICD-10-CM | POA: Diagnosis not present

## 2014-06-30 DIAGNOSIS — N879 Dysplasia of cervix uteri, unspecified: Secondary | ICD-10-CM

## 2014-06-30 DIAGNOSIS — IMO0002 Reserved for concepts with insufficient information to code with codable children: Secondary | ICD-10-CM

## 2014-06-30 NOTE — Progress Notes (Addendum)
44 y.o. G0P0 Single AA female here for LEEP due to ECC showing "disppearance of dysplasia" on ECC noted with biopsies at colposcopy performed 06/05/14.  Pap obtained before colposcopy showed HGSIL. Pt has had prior abnormal Pap smear and the HGSIL pap was noted with follow up.     Patient's last menstrual period was 06/17/2014 (exact date).          Sexually active: No.  The current method of family planning is none.     Pre-procedure vitals: Blood pressure 122/84, pulse 76, height 5' 5.75" (1.67 m), weight 182 lb 6.4 oz (82.736 kg), last menstrual period 06/17/2014.   Procedure explained and patient's questions were invited and answered.   Consent form signed.  Pre-procedure medication:  800mg  Motrin.  Time given:  900 at home  Procedure Set-up: Grounding pad located left thigh.  Cautery settings: 50 cut/50 coagulation.  Suction applied to coated speculum.  Procedure:  Speculum placed with good visualization of the cervix.  Colposcopy performed showing:  no visible lesions.  Cervix anesthetized using 2% Xylocaine with 1:100,000units Epinephrine.  Lot #[id number] with exp 10/17.  6 cc's used.  Entire transition zone excised with 10 x 10 loop in 3 passes.  One of these was a deep endocervical specimen.  Specimen(s) placed on cork and labeled for pathology.  Hemostasis obtained with ball cautery and Monsel's solution.  EBL:  Minimal  Complications:  none  Patient tolerated procedure well and left the office in satisfactory condition.  Plan:  After visit summary given.  Repeat pap will be planned after pathology is back.

## 2014-07-03 LAB — IPS OTHER TISSUE BIOPSY

## 2014-07-08 ENCOUNTER — Telehealth: Payer: Self-pay | Admitting: Emergency Medicine

## 2014-07-08 ENCOUNTER — Encounter: Payer: Self-pay | Admitting: Emergency Medicine

## 2014-07-08 NOTE — Telephone Encounter (Signed)
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  Needs repeat Pap 6 months.  06 recall.

## 2014-07-08 NOTE — Telephone Encounter (Signed)
Message left to return call to Kitt Minardi at [phone].    

## 2014-07-22 NOTE — Telephone Encounter (Signed)
Call to patient. She is given message from Dr. Sabra Heck and recall is in place. She has annual exam scheduled with Dr. Sabra Heck for 11/10/14 Routing to provider for final review. Patient agreeable to disposition. Will close encounter.

## 2014-11-10 ENCOUNTER — Encounter: Payer: Self-pay | Admitting: Obstetrics & Gynecology

## 2014-11-10 ENCOUNTER — Ambulatory Visit (INDEPENDENT_AMBULATORY_CARE_PROVIDER_SITE_OTHER): Payer: BLUE CROSS/BLUE SHIELD | Admitting: Obstetrics & Gynecology

## 2014-11-10 VITALS — BP 122/78 | HR 60 | Resp 16 | Ht 65.75 in | Wt 181.0 lb

## 2014-11-10 DIAGNOSIS — Z0000 Encounter for general adult medical examination without abnormal findings: Secondary | ICD-10-CM | POA: Diagnosis not present

## 2014-11-10 DIAGNOSIS — Z01419 Encounter for gynecological examination (general) (routine) without abnormal findings: Secondary | ICD-10-CM | POA: Diagnosis not present

## 2014-11-10 DIAGNOSIS — Z202 Contact with and (suspected) exposure to infections with a predominantly sexual mode of transmission: Secondary | ICD-10-CM | POA: Diagnosis not present

## 2014-11-10 DIAGNOSIS — E559 Vitamin D deficiency, unspecified: Secondary | ICD-10-CM | POA: Diagnosis not present

## 2014-11-10 DIAGNOSIS — Z0184 Encounter for antibody response examination: Secondary | ICD-10-CM

## 2014-11-10 DIAGNOSIS — Z124 Encounter for screening for malignant neoplasm of cervix: Secondary | ICD-10-CM | POA: Diagnosis not present

## 2014-11-10 LAB — COMPREHENSIVE METABOLIC PANEL
ALBUMIN: 4.3 g/dL (ref 3.6–5.1)
ALK PHOS: 58 U/L (ref 33–115)
ALT: 16 U/L (ref 6–29)
AST: 17 U/L (ref 10–30)
BILIRUBIN TOTAL: 0.4 mg/dL (ref 0.2–1.2)
BUN: 13 mg/dL (ref 7–25)
CALCIUM: 9.6 mg/dL (ref 8.6–10.2)
CO2: 26 mmol/L (ref 20–31)
CREATININE: 0.79 mg/dL (ref 0.50–1.10)
Chloride: 103 mmol/L (ref 98–110)
Glucose, Bld: 88 mg/dL (ref 65–99)
Potassium: 4.2 mmol/L (ref 3.5–5.3)
SODIUM: 138 mmol/L (ref 135–146)
TOTAL PROTEIN: 7.2 g/dL (ref 6.1–8.1)

## 2014-11-10 LAB — CBC
HEMATOCRIT: 41.3 % (ref 36.0–46.0)
HEMOGLOBIN: 13.6 g/dL (ref 12.0–15.0)
MCH: 30.6 pg (ref 26.0–34.0)
MCHC: 32.9 g/dL (ref 30.0–36.0)
MCV: 93 fL (ref 78.0–100.0)
MPV: 8.5 fL — AB (ref 8.6–12.4)
Platelets: 299 10*3/uL (ref 150–400)
RBC: 4.44 MIL/uL (ref 3.87–5.11)
RDW: 14.3 % (ref 11.5–15.5)
WBC: 8.1 10*3/uL (ref 4.0–10.5)

## 2014-11-10 LAB — LIPID PANEL
CHOLESTEROL: 167 mg/dL (ref 125–200)
HDL: 48 mg/dL (ref >=46)
LDL Cholesterol: 106 mg/dL (ref <130)
TRIGLYCERIDES: 64 mg/dL (ref <150)
Total CHOL/HDL Ratio: 3.5 Ratio (ref <=5.0)
VLDL: 13 mg/dL (ref <30)

## 2014-11-10 NOTE — Progress Notes (Signed)
44 y.o. G0P0 SingleAfrican AmericanF here for annual exam.  Doing well.  Menstrual cycles regular with normal flow.  Pt reports flow seems to be lighter this year.  Has minimal cramping.    H/O HGSIL pap 5/16 with colpo 5/16 with +ECC.  LEep done 06/30/14 with CIN2 and negative margins.    Requests STD testing.    Was advised by PCP to get chicken pox vaccine since pt can't remember ever having chicken pox.  Pt was not tested for antibodies.  Will do this today as well.  PCP:  Dr. Baird [facility].  Hasn't been seen this year.   Patient's last menstrual period was 10/21/2014.          Sexually active: No.  The current method of family planning is none.    Exercising: Yes.    treadmill walking and eliptical Smoker:  no  Health Maintenance: Pap:  05/20/14 HGSIL, 06/10/14 colposcopy-HPV changes, ECC dysplasia, 06/30/14 LEEP-CIN II, negative margins History of abnormal Pap:  yes MMG:  05/26/14 3D-BiRads 1-negative Colonoscopy:  none BMD:   none TDaP:  2009 Screening Labs: today, Hb today: today, Urine today: WBC-trace, PROTEIN-trace, RBC-trace   reports that she has never smoked. She has never used smokeless tobacco. She reports that she drinks about 0.6 oz of alcohol per week. She reports that she does not use illicit drugs.  Past Medical History  Diagnosis Date  . Fibroids   . Dysmenorrhea   . Menorrhagia   . Abnormal Pap smear, low grade squamous intraepithelial lesion (LGSIL) 05/27/2002    colpo bx. Chronic cervicitis  . Abnormal Pap smear, low grade squamous intraepithelial lesion (LGSIL) 11/22/2002    no colpo  . Abnormal Pap smear, low grade squamous intraepithelial lesion (LGSIL) 05/30/2003    no colpo  . Anemia     Past Surgical History  Procedure Laterality Date  . Colposcopy w/ biopsy / curettage  08/22/2002    Chroni cervicitis w/o  dysplasia  . Myomectomy abdominal approach  01/01/13    [facility] infertility    Current Outpatient Prescriptions  Medication Sig Dispense Refill   . Ascorbic Acid (VITAMIN C PO) Take by mouth. Take 2 daily    . Cyanocobalamin (VITAMIN B-12 PO) Take by mouth. Take 2 daily    . ferrous sulfate 325 (65 FE) MG tablet Take 325 mg by mouth daily with breakfast.    . fish oil-omega-3 fatty acids 1000 MG capsule Take 2 g by mouth daily.    . Multiple Vitamin (MULTIVITAMIN WITH MINERALS) TABS Take 1 tablet by mouth daily.    . naproxen sodium (ANAPROX) 220 MG tablet Take 220 mg by mouth as needed.     No current facility-administered medications for this visit.    Family History  Problem Relation Age of Onset  . Diabetes Mother   . Hypertension Mother   . Breast cancer Mother   . Cancer Mother   . Breast cancer Maternal Aunt     late 30's  . Hypertension Father   . Cancer Maternal Grandmother     leukemia  . Diabetes Maternal Grandmother   . Heart failure Maternal Grandfather   . Heart disease Maternal Grandfather   . Breast cancer Cousin 44    07/2013  . Heart disease Cousin   . Diabetes Paternal Grandmother     ROS:  Pertinent items are noted in HPI.  Otherwise, a comprehensive ROS was negative.  Exam:   BP 122/78 mmHg  Pulse 60  Resp 16  Ht 5' 5.75" (1.67 m)  Wt 181 lb (82.101 kg)  BMI 29.44 kg/m2  LMP 10/21/2014  Height: 5' 5.75" (167 cm) (with shoes)  Ht Readings from Last 3 Encounters:  11/10/14 5' 5.75" (1.67 m)  06/30/14 5' 5.75" (1.67 m)  04/09/14 $RemoveB'5\' 5"'MvrGROaE$  (1.651 m)    General appearance: alert, cooperative and appears stated age Head: Normocephalic, without obvious abnormality, atraumatic Neck: no adenopathy, supple, symmetrical, trachea midline and thyroid normal to inspection and palpation Lungs: clear to auscultation bilaterally Breasts: normal appearance, no masses or tenderness Heart: regular rate and rhythm Abdomen: soft, non-tender; bowel sounds normal; no masses,  no organomegaly Extremities: extremities normal, atraumatic, no cyanosis or edema Skin: Skin color, texture, turgor normal. No rashes or  lesions Lymph nodes: Cervical, supraclavicular, and axillary nodes normal. No abnormal inguinal nodes palpated Neurologic: Grossly normal   Pelvic: External genitalia:  no lesions              Urethra:  normal appearing urethra with no masses, tenderness or lesions              Bartholins and Skenes: normal                 Vagina: normal appearing vagina with normal color and discharge, no lesions              Cervix: no lesions              Pap taken: Yes.   Bimanual Exam:  Uterus:  normal size, contour, position, consistency, mobility, non-tender              Adnexa: normal adnexa and no mass, fullness, tenderness               Rectovaginal: Confirms               Anus:  normal sphincter tone, no lesions  Chaperone was present for exam.  A:  Well Woman with normal exam H/O uterine fibroids, s/p myomectomy x 2, 2010 with Dr. 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  P: Mammogram yearly.  Pt doing 3D MMG Pap today.  Pt desires six month follow up and not yearly.  Aware of guideline recommendations Varicella zoster Ab CMP, Lipids, TSH, Vit D, CBC Gonorrhea and chlamydia testing today Hep B, HIV, RPR [facility]EX 1 year or follow up prn

## 2014-11-10 NOTE — Addendum Note (Signed)
Addended by: Robley Fries on: 11/10/2014 01:28 PM   Modules accepted: Orders, SmartSet

## 2014-11-11 LAB — TSH: TSH: 2.071 u[IU]/mL (ref 0.350–4.500)

## 2014-11-11 LAB — STD PANEL
HIV 1&2 Ab, 4th Generation: NONREACTIVE
Hepatitis B Surface Ag: NEGATIVE

## 2014-11-11 LAB — VARICELLA ZOSTER ANTIBODY, IGG: Varicella IgG: 21.72 Index (ref <135.00)

## 2014-11-11 LAB — VITAMIN D 25 HYDROXY (VIT D DEFICIENCY, FRACTURES): Vit D, 25-Hydroxy: 12 ng/mL — ABNORMAL LOW (ref 30–100)

## 2014-11-12 LAB — IPS PAP TEST WITH REFLEX TO HPV

## 2014-11-12 LAB — IPS N GONORRHOEA AND CHLAMYDIA BY PCR

## 2014-11-14 MED ORDER — VITAMIN D (ERGOCALCIFEROL) 1.25 MG (50000 UNIT) PO CAPS: 50000.0000 [IU] | ORAL_CAPSULE | ORAL | Status: DC

## 2014-11-14 NOTE — Addendum Note (Signed)
Addended by: Megan Salon on: 11/14/2014 05:59 AM   Modules accepted: Orders, SmartSet

## 2014-11-19 ENCOUNTER — Telehealth: Payer: Self-pay | Admitting: Emergency Medicine

## 2014-11-19 NOTE — Telephone Encounter (Signed)
-----   Message from Megan Salon, MD sent at 11/14/2014  5:56 AM EDT ----- Please inform GC/CHL negative.  Also HIV/RPR/Hep B negative.    She does not have immunity to varicella (chicken pox) so she should be the vaccine.  Her PCP recommended this but she wasn't sure about doing it.  She can get it with her PCP.  Vit D is low at 12.  Needs 50K weekly and repeat level in 12 weeks.  Rx and lab order entered this morning.  TSH, CMP, Lipids, CBC normal.  Lastly, pap showed LGSIL.  Pt had LEEP in June.  Wanted pap day of visit.  I do not think she needs a colposcopy yet.  Would repeat in about six month (around late April/early May) with HR HPV testing and determine then if repeat colposcopy indicated.  Please schedule follow up Pap.  Should be 08 recall as one year but pt doesn't need AEX as done this week.

## 2014-11-19 NOTE — Telephone Encounter (Signed)
Patient notified of message from Dr. Sabra Heck and is agreeable to instructions. Discussed lab results/Pap smear results. Follow up lab appointment and pap smear appointment scheduled. Patient will follow up with PCP regarding Varicella vaccine.  08 Recall entered.  Routing to provider for final review. Patient agreeable to disposition. Will close encounter.

## 2015-02-08 ENCOUNTER — Other Ambulatory Visit: Payer: Self-pay | Admitting: Obstetrics & Gynecology

## 2015-02-09 NOTE — Telephone Encounter (Signed)
Medication refill request: Vitamin D3 Last AEX:  11-10-14 Next AEX: not yet scheduled Last MMG (if hormonal medication request): 05-26-14 WNL Refill authorized: please advise

## 2015-02-23 ENCOUNTER — Other Ambulatory Visit: Payer: BLUE CROSS/BLUE SHIELD

## 2015-02-23 DIAGNOSIS — E559 Vitamin D deficiency, unspecified: Secondary | ICD-10-CM

## 2015-02-23 DIAGNOSIS — Z1329 Encounter for screening for other suspected endocrine disorder: Secondary | ICD-10-CM

## 2015-02-23 LAB — TSH: TSH: 1.25 m[IU]/L

## 2015-02-24 LAB — VITAMIN D 25 HYDROXY (VIT D DEFICIENCY, FRACTURES): Vit D, 25-Hydroxy: 44 ng/mL (ref 30–100)

## 2015-04-29 ENCOUNTER — Other Ambulatory Visit: Payer: Self-pay

## 2015-04-29 DIAGNOSIS — Z1231 Encounter for screening mammogram for malignant neoplasm of breast: Secondary | ICD-10-CM

## 2015-05-15 ENCOUNTER — Ambulatory Visit: Payer: BLUE CROSS/BLUE SHIELD | Admitting: Obstetrics & Gynecology

## 2015-05-21 ENCOUNTER — Ambulatory Visit: Payer: BLUE CROSS/BLUE SHIELD | Admitting: Obstetrics & Gynecology

## 2015-06-02 ENCOUNTER — Ambulatory Visit (INDEPENDENT_AMBULATORY_CARE_PROVIDER_SITE_OTHER): Payer: BLUE CROSS/BLUE SHIELD | Admitting: Obstetrics & Gynecology

## 2015-06-02 ENCOUNTER — Encounter: Payer: Self-pay | Admitting: Obstetrics & Gynecology

## 2015-06-02 VITALS — BP 122/76 | HR 90 | Resp 14 | Ht 65.5 in | Wt 180.0 lb

## 2015-06-02 DIAGNOSIS — N871 Moderate cervical dysplasia: Secondary | ICD-10-CM | POA: Diagnosis not present

## 2015-06-02 NOTE — Progress Notes (Signed)
45 y.o. G0P0 Single African AmericanF here for repeat Pap smear due to history of CIN2.  H/O LEEP 6/16 with positive margin.  LGSIL pap obtained 10/16 at her [facility].  This was a little early so she and I decided to proceed with a six month pap.  Denies abnormal bleeding.  Cycles regular.  No intermenstrual spotting.  Going to [place] on vacation for next week.  Leaving on Saturday.    Today she denies vaginal symptoms or STD concerns.  EXAM: BP 122/76 mmHg  Pulse 90  Resp 14  Ht 5' 5.5" (1.664 m)  Wt 180 lb (81.647 kg)  BMI 29.49 kg/m2  LMP 05/03/2015  Physical Exam  Constitutional: She is oriented to person, place, and time. She appears well-developed and well-nourished.  GI: Soft. Bowel sounds are normal.  Genitourinary: Vagina normal. There is no rash, tenderness or lesion on the right labia. There is no rash, tenderness or lesion on the left labia. Uterus is enlarged (about 8 weeks and globular). Right adnexum displays no mass, no tenderness and no fullness. Left adnexum displays no mass, no tenderness and no fullness.  Pap obtained  Lymphadenopathy:       Right: No inguinal adenopathy present.       Left: No inguinal adenopathy present.  Neurological: She is alert and oriented to person, place, and time.  Skin: Skin is warm and dry.  Psychiatric: She has a normal mood and affect.    Assessment: H/O CIN 2, s/p LEEP 6/17  Plan: Follow up will be planned pending results

## 2015-06-04 ENCOUNTER — Ambulatory Visit
Admission: RE | Admit: 2015-06-04 | Discharge: 2015-06-04 | Disposition: A | Discharge status: Home / Self Care | Payer: BLUE CROSS/BLUE SHIELD | Source: Ambulatory Visit

## 2015-06-04 DIAGNOSIS — Z1231 Encounter for screening mammogram for malignant neoplasm of breast: Secondary | ICD-10-CM

## 2015-06-05 LAB — IPS PAP TEST WITH HPV

## 2015-06-10 ENCOUNTER — Telehealth: Payer: Self-pay | Admitting: Emergency Medicine

## 2015-06-10 NOTE — Telephone Encounter (Signed)
-----   Message from Megan Salon, MD sent at 06/06/2015  7:58 AM EDT ----- Please let pt know that pap was ASCUS.  However, high risk HPV was negative.  Her LEEP was in June of last year.  OK for pt to have Pap and HR HPV repeated at [facility].  May sure scheduled for six months from now.  Out of current recall.  08 recall needed.  CC:  Karmen Bongo, RN.

## 2015-06-10 NOTE — Telephone Encounter (Signed)
Call to patient and she is given results from Dr. Sabra Heck. Verbalized understanding of results and will follow up as scheduled. Annual exam scheduled with Dr. Sabra Heck for 11/19/15. Current recall completed. 08 recall currently in place.  Routing to provider for final review. Patient agreeable to disposition. Will close encounter.

## 2015-08-24 ENCOUNTER — Encounter: Payer: Self-pay | Admitting: Obstetrics & Gynecology

## 2015-08-24 ENCOUNTER — Ambulatory Visit (INDEPENDENT_AMBULATORY_CARE_PROVIDER_SITE_OTHER): Payer: BLUE CROSS/BLUE SHIELD | Admitting: Obstetrics & Gynecology

## 2015-08-24 ENCOUNTER — Telehealth: Payer: Self-pay | Admitting: Obstetrics & Gynecology

## 2015-08-24 VITALS — BP 116/82 | HR 94 | Temp 99.7°F | Resp 18 | Ht 65.5 in | Wt 184.4 lb

## 2015-08-24 DIAGNOSIS — N6452 Nipple discharge: Secondary | ICD-10-CM

## 2015-08-24 NOTE — Progress Notes (Signed)
GYNECOLOGY  VISIT   HPI: 45 y.o. G0P0 Single African American female with complaint of noting some "staining" in her bra over the last five days.  At first, she thought this might be due to cutting herself while shaving but she's realized that it is coming from her right nipple.  Yesterday she compressed her breast and saw it again.  It is dark looking.  It is not frank blood.  Denies recent trauma or bruising.  She's noted no masses or pain.  Patient's last menstrual period was 08/20/2015 (exact date).     Patient Active Problem List   Diagnosis Date Noted  . LGSIL (low grade squamous intraepithelial dysplasia) 05/25/2014  . Fibroids 05/17/2012    Past Medical History:  Diagnosis Date  . Abnormal Pap smear, low grade squamous intraepithelial lesion (LGSIL) 05/27/2002   colpo bx. Chronic cervicitis  . Abnormal Pap smear, low grade squamous intraepithelial lesion (LGSIL) 11/22/2002   no colpo  . Abnormal Pap smear, low grade squamous intraepithelial lesion (LGSIL) 05/30/2003   no colpo  . Anemia   . Dysmenorrhea   . Fibroids   . Menorrhagia     Past Surgical History:  Procedure Laterality Date  . COLPOSCOPY W/ BIOPSY / CURETTAGE  2011   Chroni cervicitis w/o  dysplasia  . Hysteroscopic myomectomy  2010   Dr. Kerin Perna  . MYOMECTOMY ABDOMINAL APPROACH  01/01/13   Rondall Allegra infertility    MEDS:  Reviewed in EPIC and UTD  ALLERGIES: Review of patient's allergies indicates no known allergies.  Family History  Problem Relation Age of Onset  . Diabetes Mother   . Hypertension Mother   . Breast cancer Mother   . Cancer Mother   . Hypertension Father   . Cancer Maternal Grandmother     leukemia  . Diabetes Maternal Grandmother   . Heart failure Maternal Grandfather   . Heart disease Maternal Grandfather   . Breast cancer Cousin 44    07/2013  . Heart disease Cousin   . Breast cancer Maternal Aunt     late 30's  . Diabetes Paternal Grandmother     SH:  Single, non  smoker  Review of Systems  All other systems reviewed and are negative.   PHYSICAL EXAMINATION:    BP 116/82 (BP Location: Right Arm, Patient Position: Sitting)   Pulse 94   Temp 99.7 F (37.6 C) (Oral)   Resp 18   Ht 5' 5.5" (1.664 m)   Wt 184 lb 6.4 oz (83.6 kg)   LMP 08/20/2015 (Exact Date)   BMI 30.22 kg/m     Physical Exam  Constitutional: She appears well-developed and well-nourished.  Neck: Normal range of motion. Neck supple. No tracheal deviation present. No thyromegaly present.  Cardiovascular: Normal rate and regular rhythm.   Respiratory: Effort normal and breath sounds normal. Right breast exhibits nipple discharge. Right breast exhibits no inverted nipple, no mass, no skin change and no tenderness. Left breast exhibits nipple discharge. Left breast exhibits no inverted nipple, no mass, no skin change and no tenderness. Breasts are symmetrical.    Lymphadenopathy:    She has no cervical adenopathy.   Chaperone was present for exam.  Assessment: Dark nipple discharge on the right with the appearance of old blood  Plan: Prolactin obtained today Will refer to imaging for ductogram on right breast.  Pt may need general surgery consult as well.  All questions answered.

## 2015-08-24 NOTE — Progress Notes (Signed)
Scheduled patient while in office for right breast ductogram with possible right breast diagnostic imaging and ultrasound on 08/26/2015 [facility]. Placed in mammogram hold.

## 2015-08-24 NOTE — Telephone Encounter (Signed)
Encounter not needed. Patient scheduled.

## 2015-08-25 DIAGNOSIS — N6452 Nipple discharge: Secondary | ICD-10-CM | POA: Insufficient documentation

## 2015-08-25 LAB — PROLACTIN: Prolactin: 12.7 ng/mL

## 2015-08-26 ENCOUNTER — Ambulatory Visit
Admission: RE | Admit: 2015-08-26 | Discharge: 2015-08-26 | Disposition: A | Discharge status: Home / Self Care | Payer: BLUE CROSS/BLUE SHIELD | Source: Ambulatory Visit | Attending: Obstetrics & Gynecology | Admitting: Obstetrics & Gynecology

## 2015-08-26 ENCOUNTER — Ambulatory Visit (INDEPENDENT_AMBULATORY_CARE_PROVIDER_SITE_OTHER): Payer: BLUE CROSS/BLUE SHIELD | Admitting: Family Medicine

## 2015-08-26 VITALS — BP 122/72 | HR 74 | Temp 98.4°F | Resp 17 | Ht 67.0 in | Wt 182.0 lb

## 2015-08-26 DIAGNOSIS — N6452 Nipple discharge: Secondary | ICD-10-CM

## 2015-08-26 DIAGNOSIS — J329 Chronic sinusitis, unspecified: Secondary | ICD-10-CM

## 2015-08-26 DIAGNOSIS — H6690 Otitis media, unspecified, unspecified ear: Secondary | ICD-10-CM | POA: Diagnosis not present

## 2015-08-26 MED ORDER — AMOXICILLIN 875 MG PO TABS: 875.0000 mg | ORAL_TABLET | Freq: Two times a day (BID) | ORAL | 0 refills | Status: DC

## 2015-08-26 NOTE — Patient Instructions (Addendum)
You have an ear infection.  Take the Amoxicillin twice a day for 7 days to clear up your ear.  If you're not better with this, let us know.  If you're getting worse despite antibiotics, come back.       IF you received an x-ray today, you will receive an invoice from [facility]. Please contact [facility] at [phone] with questions or concerns regarding your invoice.   IF you received labwork today, you will receive an invoice from [employer]. Please contact [facility] at [phone] with questions or concerns regarding your invoice.   Our billing staff will not be able to assist you with questions regarding bills from these companies.  You will be contacted with the lab results as soon as they are available. The fastest way to get your results is to activate your My Chart account. Instructions are located on the last page of this paperwork. If you have not heard from [place] regarding the results in 2 weeks, please contact this office.

## 2015-08-26 NOTE — Progress Notes (Signed)
Emily Harris is a 45 y.o. female who presents to Urgent Care today for clogged ear:  1.  Clogged ear:  Present since Saturday (4 days).  She has been fighting URI symptoms for about a week.  No trouble with her ear until Saturday.  Drainage and cough are improving, but still with feelings of "ear being clogged."  No real ear pain, some mild pressure.  No fevers or chills.    2.  Recurrent sinusitis:  She has multiple bouts of sinus drainage and pressure throughout the year.  Doesn't necessarily present to care when these occur.  Using daily nasal saline for improvement, but this often doesn't work.  She is asking for referral to see if this can improve.    ROS as above.  Pertinently, no chest pain, palpitations, SOB, Fever, Chills, Abd pain, N/V/D.   PMH reviewed. Patient is a nonsmoker.   Past Medical History:  Diagnosis Date  . Abnormal Pap smear, low grade squamous intraepithelial lesion (LGSIL) 05/27/2002   colpo bx. Chronic cervicitis  . Abnormal Pap smear, low grade squamous intraepithelial lesion (LGSIL) 11/22/2002   no colpo  . Abnormal Pap smear, low grade squamous intraepithelial lesion (LGSIL) 05/30/2003   no colpo  . Anemia   . Dysmenorrhea   . Fibroids   . Menorrhagia    Past Surgical History:  Procedure Laterality Date  . COLPOSCOPY W/ BIOPSY / CURETTAGE  2011   Chroni cervicitis w/o  dysplasia  . Hysteroscopic myomectomy  2010   Dr. Kerin Perna  . MYOMECTOMY ABDOMINAL APPROACH  01/01/13   Rondall Allegra infertility    Medications reviewed. Current Outpatient Prescriptions  Medication Sig Dispense Refill  . Ascorbic Acid (VITAMIN C PO) Take by mouth. Take 2 daily    . Multiple Vitamin (MULTIVITAMIN WITH MINERALS) TABS Take 1 tablet by mouth daily. Reported on 06/02/2015    . IRON PO Take by mouth.    . naproxen sodium (ANAPROX) 220 MG tablet Take 220 mg by mouth as needed.     No current facility-administered medications for this visit.      Physical Exam:   BP 122/72 (BP Location: Right Arm, Patient Position: Sitting, Cuff Size: Normal)   Pulse 74   Temp 98.4 F (36.9 C) (Oral)   Resp 17   Ht 5\' 7"  (1.702 m)   Wt 182 lb (82.6 kg)   LMP 08/20/2015 (Exact Date)   SpO2 97%   BMI 28.51 kg/m  Gen:  Alert, cooperative patient who appears stated age in no acute distress.  Vital signs reviewed. HEENT:  Orient/AT.  EOMI, PERRL.  MMM, tonsils non-erythematous, non-edematous.  External ears WNL, Right Ear with normal TM.  Left TM with redness/bulging.  Purulence noted behind TM.  MMM Neck:  Supple, No LAD.   Lungs:  Clear.    Assessment and Plan:  1.  Acute otitis media; - treat with amoxicillin - FU if no improvement.   2.  Recurrent sinusisit:   - patient desires ENT referral for recurrent sinusitis.   - Will place today.  - continue with nasal saline until visit.   - declines Flonase today

## 2015-08-28 ENCOUNTER — Other Ambulatory Visit: Payer: Self-pay | Admitting: General Surgery

## 2015-09-04 ENCOUNTER — Ambulatory Visit (INDEPENDENT_AMBULATORY_CARE_PROVIDER_SITE_OTHER): Payer: BLUE CROSS/BLUE SHIELD | Admitting: Physician Assistant

## 2015-09-04 VITALS — BP 118/72 | HR 94 | Temp 98.4°F | Resp 18 | Ht 67.0 in | Wt 182.0 lb

## 2015-09-04 DIAGNOSIS — H65192 Other acute nonsuppurative otitis media, left ear: Secondary | ICD-10-CM

## 2015-09-04 DIAGNOSIS — H6982 Other specified disorders of Eustachian tube, left ear: Secondary | ICD-10-CM | POA: Diagnosis not present

## 2015-09-04 MED ORDER — FLUTICASONE PROPIONATE 50 MCG/ACT NA SUSP: 2.0000 | Freq: Every day | NASAL | 0 refills | Status: DC

## 2015-09-04 MED ORDER — PSEUDOEPHEDRINE HCL 60 MG PO TABS: 60.0000 mg | ORAL_TABLET | Freq: Four times a day (QID) | ORAL | 0 refills | Status: DC | PRN

## 2015-09-04 MED ORDER — AMOXICILLIN 875 MG PO TABS: 875.0000 mg | ORAL_TABLET | Freq: Two times a day (BID) | ORAL | 0 refills | Status: AC

## 2015-09-04 NOTE — Patient Instructions (Addendum)
Please take the amoxicillin for the addition of 3 days.  Please do the nasal spray.  Make sure that you are hydrating.  Please let us know if there is no improvement.     IF you received an x-ray today, you will receive an invoice from [facility]. Please contact [facility] at [phone] with questions or concerns regarding your invoice.   IF you received labwork today, you will receive an invoice from [employer]. Please contact [facility] at [phone] with questions or concerns regarding your invoice.   Our billing staff will not be able to assist you with questions regarding bills from these companies.  You will be contacted with the lab results as soon as they are available. The fastest way to get your results is to activate your My Chart account. Instructions are located on the last page of this paperwork. If you have not heard from [place] regarding the results in 2 weeks, please contact this office.

## 2015-09-04 NOTE — Progress Notes (Signed)
Patient ID: [name], female   DOB: November 17, 1970, 45 y.o.   MRN: [id number] [facility] [address], [place] [address] [phone]  Date:  09/04/2015   Name:  Emily Harris   DOB:  01-05-1971   MRN:  [id number]  PCP:  Maximino Greenland, MD    History of Present Illness:  Emily Harris is a 45 y.o. female patient who presents to [facility] for a follow-up of otis media discovered 11 days ago. Pt had clogged ear sensation for 4 days prior to visit and URI for approximately 1 week. She was treated with amoxicillin for 7 days. Pt states that her symptoms have improved but her left ear still feels a little muffled and mild congestion. She denies fever. She has been complaint with amoxicillin; finished her last dose today. She is not currently using any nasal sprays. Pt has an upcoming appointment with ENT on 09/17/15 for recurrent sinusitis.   Patient Active Problem List   Diagnosis Date Noted   Nipple discharge 08/25/2015   LGSIL (low grade squamous intraepithelial dysplasia) 05/25/2014   Fibroids 05/17/2012    Past Medical History:  Diagnosis Date   Abnormal Pap smear, low grade squamous intraepithelial lesion (LGSIL) 05/27/2002   colpo bx. Chronic cervicitis   Abnormal Pap smear, low grade squamous intraepithelial lesion (LGSIL) 11/22/2002   no colpo   Abnormal Pap smear, low grade squamous intraepithelial lesion (LGSIL) 05/30/2003   no colpo   Anemia    Dysmenorrhea    Fibroids    Menorrhagia     Past Surgical History:  Procedure Laterality Date   COLPOSCOPY W/ BIOPSY / CURETTAGE  2011   Chroni cervicitis w/o  dysplasia   Hysteroscopic myomectomy  2010   Dr. Aris Everts ABDOMINAL APPROACH  01/01/13   Rondall Allegra infertility    Social History  Substance Use Topics   Smoking status: Never Smoker   Smokeless tobacco: Never Used   Alcohol use 0.6 oz/week    1 Standard drinks or equivalent per week     Comment:  socially    Family History  Problem Relation Age of Onset   Diabetes Mother    Hypertension Mother    Breast cancer Mother    Cancer Mother    Hypertension Father    Cancer Maternal Grandmother     leukemia   Diabetes Maternal Grandmother    Heart failure Maternal Grandfather    Heart disease Maternal Grandfather    Breast cancer Cousin 44    07/2013   Heart disease Cousin    Breast cancer Maternal Aunt     late 30's   Diabetes Paternal Grandmother     No Known Allergies  Medication list has been reviewed and updated.  Current Outpatient Prescriptions on File Prior to Visit  Medication Sig Dispense Refill   amoxicillin (AMOXIL) 875 MG tablet Take 1 tablet (875 mg total) by mouth 2 (two) times daily. 20 tablet 0   Ascorbic Acid (VITAMIN C PO) Take by mouth. Take 2 daily     IRON PO Take by mouth.     Multiple Vitamin (MULTIVITAMIN WITH MINERALS) TABS Take 1 tablet by mouth daily. Reported on 06/02/2015     naproxen sodium (ANAPROX) 220 MG tablet Take 220 mg by mouth as needed.     No current facility-administered medications on file prior to visit.     Review of Systems  HENT: Positive for congestion.  Physical Examination: BP 118/72 (BP Location: Right Arm, Patient Position: Sitting, Cuff Size: Small)    Pulse 94    Temp 98.4 F (36.9 C) (Oral)    Resp 18    Ht 5\' 7"  (1.702 m)    Wt 182 lb (82.6 kg)    LMP 08/20/2015 (Exact Date)    SpO2 97%    BMI 28.51 kg/m  Ideal Body Weight: @FLOWAMB [id number]  Physical Exam  Constitutional: She is oriented to person, place, and time. She appears well-developed and well-nourished. No distress.  HENT:  Head: Normocephalic and atraumatic.  Right Ear: Tympanic membrane, external ear and ear canal normal.  Left Ear: External ear and ear canal normal. Tympanic membrane is bulging (slightly). Tympanic membrane is not injected.  No middle ear effusion.  Nose: Mucosal edema and rhinorrhea (mild) present. Right  sinus exhibits no maxillary sinus tenderness and no frontal sinus tenderness. Left sinus exhibits no maxillary sinus tenderness and no frontal sinus tenderness.  Mouth/Throat: No uvula swelling. No oropharyngeal exudate, posterior oropharyngeal edema or posterior oropharyngeal erythema.  Mildly dull without injection. Slighlty bulging. No effusion.   Eyes: Conjunctivae and EOM are normal. Pupils are equal, round, and reactive to light.  Cardiovascular: Normal rate, regular rhythm, normal heart sounds and normal pulses.  Exam reveals no gallop, no distant heart sounds and no friction rub.   No murmur heard. Pulmonary/Chest: Effort normal and breath sounds normal. No respiratory distress. She has no decreased breath sounds. She has no wheezes. She has no rhonchi. She has no rales.  Lymphadenopathy:       Head (right side): No submandibular, no tonsillar, no preauricular and no posterior auricular adenopathy present.       Head (left side): No submandibular, no tonsillar, no preauricular and no posterior auricular adenopathy present.  Neurological: She is alert and oriented to person, place, and time.  Skin: She is not diaphoretic.  Psychiatric: She has a normal mood and affect. Her behavior is normal.    Assessment and Plan: [name] is a 45 y.o. female who is here today for left-sided ear pain. We will treat for possible infection of left ear. Will also treat eustachian tube dysfunction with Sudafed and Flonase. Advised precaution with Sudafed. Patient voiced understanding. Patient will return to clinic as needed.  Subacute nonsuppurative otitis media of left ear - Plan: amoxicillin (AMOXIL) 875 MG tablet, fluticasone (FLONASE) 50 MCG/ACT nasal spray, pseudoephedrine (SUDAFED) 60 MG tablet  Eustachian tube dysfunction, left - Plan: amoxicillin (AMOXIL) 875 MG tablet, fluticasone (FLONASE) 50 MCG/ACT nasal spray, pseudoephedrine (SUDAFED) 60 MG tablet  Ivar Drape, PA-C [facility] 8/26/20178:45 PM  I personally performed the services described in this documentation, which was scribed in my presence. The recorded information has been reviewed and is accurate.  Ivar Drape, PA-C [facility] 09/04/2015 1:41 PM

## 2015-09-24 ENCOUNTER — Encounter (HOSPITAL_BASED_OUTPATIENT_CLINIC_OR_DEPARTMENT_OTHER): Payer: Self-pay | Admitting: *Deleted

## 2015-09-29 NOTE — H&P (Signed)
[place]  Location: [facility] Patient #: [id number] DOB: May 13, 1970 Single / Language: Cleophus Molt / Race: Black or African American Female   History of Present Illness  Patient words: new pt, bloody nipple.  The patient is a 45 year old female who presents with a complaint of nipple discharge. Patient is a 45 year old female referred for consultation by Dr. Glennon Mac for a new diagnosis of bloody right nipple discharge. She noted this around a week ago. She says at first it was clear shallow but then was brown colored. She underwent imaging to evaluate this and was found to have a dilated duct underneath the areola with a 7 mm intraductal mass. Showed no other concerning findings on imaging. She had a mother who had breast cancer in her 50s, maternal aunt with breast cancer and a maternal cousin with breast cancer that was diagnosed at age 44. All of these individuals had genetic testing with those most recent one being in the last few years. Genetic testing was negative for a known mutation.  She had menarche at age 63. She is not postmenopausal. She is a G0P0 and has taken multiple years of oral contraceptives.   dx mammogram/ultrasound IMPRESSION: 1. Single duct bloody nipple discharge arising from a duct located the 6 o'clock position than the right nipple with a dilated duct and a 6 mm intraductal mass located within the right breast at the 6 o'clock position in a subareolar location. Surgical consultation for possible duct excision is recommended.   Other Problems  No pertinent past medical history  Past Surgical History Cesarean Section - 1  Diagnostic Studies History  Colonoscopy never Mammogram within last year Pap Smear 1-5 years ago  Allergies  No Known Drug Allergies  Medication History Amoxicillin (875MG  Tablet, Oral) Active. Vitamin D (Cholecalciferol) (Oral) Specific dose unknown - Active. Medications Reconciled  Social  History  Alcohol use Occasional alcohol use. Caffeine use Carbonated beverages, Coffee, Tea. No drug use Tobacco use Never smoker.  Family History Arthritis Mother. Breast Cancer Mother. Diabetes Mellitus Father, Mother. Hypertension Father. Kidney Disease Mother. Thyroid problems Mother.  Pregnancy / Birth History Age at menarche 72 years. Contraceptive History Oral contraceptives. Gravida 0 Para 0 Regular periods    Review of Systems  General Not Present- Appetite Loss, Chills, Fatigue, Fever, Night Sweats, Weight Gain and Weight Loss. Skin Not Present- Change in Wart/Mole, Dryness, Hives, Jaundice, New Lesions, Non-Healing Wounds, Rash and Ulcer. HEENT Present- Hoarseness and Wears glasses/contact lenses. Not Present- Earache, Hearing Loss, Nose Bleed, Oral Ulcers, Ringing in the Ears, Seasonal Allergies, Sinus Pain, Sore Throat, Visual Disturbances and Yellow Eyes. Breast Present- Nipple Discharge. Not Present- Breast Mass, Breast Pain and Skin Changes. Cardiovascular Not Present- Chest Pain, Difficulty Breathing Lying Down, Leg Cramps, Palpitations, Rapid Heart Rate, Shortness of Breath and Swelling of Extremities. Gastrointestinal Not Present- Abdominal Pain, Bloating, Bloody Stool, Change in Bowel Habits, Chronic diarrhea, Constipation, Difficulty Swallowing, Excessive gas, Gets full quickly at meals, Hemorrhoids, Indigestion, Nausea, Rectal Pain and Vomiting. Female Genitourinary Not Present- Frequency, Nocturia, Painful Urination, Pelvic Pain and Urgency. Musculoskeletal Not Present- Back Pain, Joint Pain, Joint Stiffness, Muscle Pain, Muscle Weakness and Swelling of Extremities. Neurological Not Present- Decreased Memory, Fainting, Headaches, Numbness, Seizures, Tingling, Tremor, Trouble walking and Weakness. Psychiatric Not Present- Anxiety, Bipolar, Change in Sleep Pattern, Depression, Fearful and Frequent crying. Endocrine Not Present- Cold Intolerance,  Excessive Hunger, Hair Changes, Heat Intolerance, Hot flashes and New Diabetes. Hematology Present- Easy Bruising. Not Present- Blood Thinners, Excessive bleeding,  Gland problems, HIV and Persistent Infections.  Vitals Weight: 181.38 lb Height: 65in Height was reported by patient. Body Surface Area: 1.9 m Body Mass Index: 30.18 kg/m  Temp.: 98.48F(Oral)  Pulse: 87 (Regular)  P.OX: 98% (Room air) BP: 130/98 (Sitting, Left Arm, Standard)    Physical Exam  General Mental Status-Alert. General Appearance-Consistent with stated age. Hydration-Well hydrated. Voice-Normal.  Head and Neck Head-normocephalic, atraumatic with no lesions or palpable masses. Trachea-midline. Thyroid Gland Characteristics - normal size and consistency.  Eye Eyeball - Bilateral-Extraocular movements intact. Sclera/Conjunctiva - Bilateral-No scleral icterus.  Chest and Lung Exam Chest and lung exam reveals -quiet, even and easy respiratory effort with no use of accessory muscles and on auscultation, normal breath sounds, no adventitious sounds and normal vocal resonance. Inspection Chest Wall - Normal. Back - normal.  Breast Note: Breasts are symmetric bilaterally. Minimal ptosis. There are no palpable masses, no nipple retraction, no skin dimpling, no lymphadenopathy on either breast. With compression, there is a small amount of bloody nipple discharge at the inferior aspect of the right nipple. This is nonspontaneous.   Cardiovascular Cardiovascular examination reveals -normal heart sounds, regular rate and rhythm with no murmurs and normal pedal pulses bilaterally.  Abdomen Inspection Inspection of the abdomen reveals - No Hernias. Palpation/Percussion Palpation and Percussion of the abdomen reveal - Soft, Non Tender, No Rebound tenderness, No Rigidity (guarding) and No hepatosplenomegaly. Auscultation Auscultation of the abdomen reveals - Bowel sounds  normal.  Neurologic Neurologic evaluation reveals -alert and oriented x 3 with no impairment of recent or remote memory. Mental Status-Normal.  Musculoskeletal Global Assessment -Note: no gross deformities.  Normal Exam - Left-Upper Extremity Strength Normal and Lower Extremity Strength Normal. Normal Exam - Right-Upper Extremity Strength Normal and Lower Extremity Strength Normal.  Lymphatic Head & Neck  General Head & Neck Lymphatics: Bilateral - Description - Normal. Axillary  General Axillary Region: Bilateral - Description - Normal. Tenderness - Non Tender. Femoral & Inguinal  Generalized Femoral & Inguinal Lymphatics: Bilateral - Description - No Generalized lymphadenopathy.    Assessment & Plan BLOODY DISCHARGE FROM RIGHT NIPPLE [phone]) Impression: We will plan a major duct excision. I discussed what that entails and that is outpatient procedure. I reviewed that we would make an incision at the lower nipple, using a probe to guide our dissection, and take a cylinder of tissue underneath the nipple. I discussed that there is a risk that we may find cancer or noninvasive cancer. I also reviewed that we may find an intraductal papilloma. If she has cancer, I would send her for genetic testing as well, but I would not send her if she does not have a diagnosis of cancer.  I discussed the risks of surgery including bleeding, infection, chronic pain, damage to adjacent structures, seroma, wound complications, heart or lung complications, and anesthetic risk. The patient wishes to proceed. Current Plans You are being scheduled for surgery - Our schedulers will call you.  You should hear from our office's scheduling department within 5 working days about the location, date, and time of surgery. We try to make accommodations for patient's preferences in scheduling surgery, but sometimes the OR schedule or the surgeon's schedule prevents [place] from making those  accommodations.  If you have not heard from our office [phone]) in 5 working days, call the office and ask for your surgeon's nurse.  If you have other questions about your diagnosis, plan, or surgery, call the office and ask for your surgeon's nurse.  Pt Education -  [facility] Breast Biopsy HCI: discussed with patient and provided information.   Signed by Stark Klein, MD

## 2015-09-30 ENCOUNTER — Ambulatory Visit (HOSPITAL_BASED_OUTPATIENT_CLINIC_OR_DEPARTMENT_OTHER): Payer: BLUE CROSS/BLUE SHIELD | Admitting: Anesthesiology

## 2015-09-30 ENCOUNTER — Ambulatory Visit (HOSPITAL_BASED_OUTPATIENT_CLINIC_OR_DEPARTMENT_OTHER)
Admission: RE | Admit: 2015-09-30 | Discharge: 2015-09-30 | Disposition: A | Discharge status: Home / Self Care | Payer: BLUE CROSS/BLUE SHIELD | Source: Ambulatory Visit | Attending: General Surgery | Admitting: General Surgery

## 2015-09-30 ENCOUNTER — Encounter (HOSPITAL_BASED_OUTPATIENT_CLINIC_OR_DEPARTMENT_OTHER)
Admission: RE | Disposition: A | Discharge status: Home / Self Care | Payer: Self-pay | Source: Ambulatory Visit | Attending: General Surgery

## 2015-09-30 ENCOUNTER — Encounter (HOSPITAL_BASED_OUTPATIENT_CLINIC_OR_DEPARTMENT_OTHER): Payer: Self-pay | Admitting: *Deleted

## 2015-09-30 DIAGNOSIS — Z803 Family history of malignant neoplasm of breast: Secondary | ICD-10-CM | POA: Insufficient documentation

## 2015-09-30 DIAGNOSIS — N6452 Nipple discharge: Secondary | ICD-10-CM | POA: Insufficient documentation

## 2015-09-30 DIAGNOSIS — N6091 Unspecified benign mammary dysplasia of right breast: Secondary | ICD-10-CM | POA: Insufficient documentation

## 2015-09-30 HISTORY — PX: BREAST DUCTAL SYSTEM EXCISION: SHX5242

## 2015-09-30 SURGERY — EXCISION DUCTAL SYSTEM BREAST
Anesthesia: General | Site: Breast | Laterality: Right

## 2015-09-30 MED ORDER — CEFAZOLIN SODIUM-DEXTROSE 2-4 GM/100ML-% IV SOLN
2.0000 g | INTRAVENOUS | Status: AC
  Administered 2015-09-30: 2 g via INTRAVENOUS

## 2015-09-30 MED ORDER — TRAMADOL HCL 50 MG PO TABS: 50.0000 mg | ORAL_TABLET | Freq: Four times a day (QID) | ORAL | 0 refills | Status: DC | PRN

## 2015-09-30 MED ORDER — OXYCODONE HCL 5 MG PO TABS: 5.0000 mg | ORAL_TABLET | ORAL | Status: DC | PRN

## 2015-09-30 MED ORDER — MIDAZOLAM HCL 2 MG/2ML IJ SOLN
1.0000 mg | INTRAMUSCULAR | Status: DC | PRN
  Administered 2015-09-30: 2 mg via INTRAVENOUS

## 2015-09-30 MED ORDER — CHLORHEXIDINE GLUCONATE CLOTH 2 % EX PADS: 6.0000 | MEDICATED_PAD | Freq: Once | CUTANEOUS | Status: DC

## 2015-09-30 MED ORDER — SODIUM CHLORIDE 0.9% FLUSH: 3.0000 mL | Freq: Two times a day (BID) | INTRAVENOUS | Status: DC

## 2015-09-30 MED ORDER — LIDOCAINE 2% (20 MG/ML) 5 ML SYRINGE
INTRAMUSCULAR | Status: AC
  Filled 2015-09-30: qty 5

## 2015-09-30 MED ORDER — LIDOCAINE 2% (20 MG/ML) 5 ML SYRINGE
INTRAMUSCULAR | Status: DC | PRN
  Administered 2015-09-30: 60 mg via INTRAVENOUS

## 2015-09-30 MED ORDER — SCOPOLAMINE 1 MG/3DAYS TD PT72: 1.0000 | MEDICATED_PATCH | Freq: Once | TRANSDERMAL | Status: DC | PRN

## 2015-09-30 MED ORDER — LACTATED RINGERS IV SOLN
INTRAVENOUS | Status: DC
  Administered 2015-09-30: 08:00:00 via INTRAVENOUS

## 2015-09-30 MED ORDER — LIDOCAINE-EPINEPHRINE (PF) 1 %-1:200000 IJ SOLN
INTRAMUSCULAR | Status: AC
  Filled 2015-09-30: qty 30

## 2015-09-30 MED ORDER — FENTANYL CITRATE (PF) 100 MCG/2ML IJ SOLN
50.0000 ug | INTRAMUSCULAR | Status: DC | PRN
  Administered 2015-09-30 (×2): 50 ug via INTRAVENOUS

## 2015-09-30 MED ORDER — FENTANYL CITRATE (PF) 100 MCG/2ML IJ SOLN
INTRAMUSCULAR | Status: AC
  Filled 2015-09-30: qty 2

## 2015-09-30 MED ORDER — BUPIVACAINE HCL (PF) 0.25 % IJ SOLN
INTRAMUSCULAR | Status: AC
  Filled 2015-09-30: qty 30

## 2015-09-30 MED ORDER — ONDANSETRON HCL 4 MG/2ML IJ SOLN
INTRAMUSCULAR | Status: DC | PRN
  Administered 2015-09-30: 4 mg via INTRAVENOUS

## 2015-09-30 MED ORDER — PROPOFOL 10 MG/ML IV BOLUS
INTRAVENOUS | Status: DC | PRN
  Administered 2015-09-30: 200 mg via INTRAVENOUS

## 2015-09-30 MED ORDER — CEFAZOLIN SODIUM-DEXTROSE 2-4 GM/100ML-% IV SOLN
INTRAVENOUS | Status: AC
  Filled 2015-09-30: qty 100

## 2015-09-30 MED ORDER — DEXAMETHASONE SODIUM PHOSPHATE 4 MG/ML IJ SOLN
INTRAMUSCULAR | Status: DC | PRN
  Administered 2015-09-30: 10 mg via INTRAVENOUS

## 2015-09-30 MED ORDER — LIDOCAINE-EPINEPHRINE (PF) 1 %-1:200000 IJ SOLN
INTRAMUSCULAR | Status: DC | PRN
  Administered 2015-09-30: 10 mL

## 2015-09-30 MED ORDER — PROPOFOL 10 MG/ML IV BOLUS
INTRAVENOUS | Status: AC
  Filled 2015-09-30: qty 20

## 2015-09-30 MED ORDER — SODIUM CHLORIDE 0.9 % IV SOLN: 250.0000 mL | INTRAVENOUS | Status: DC | PRN

## 2015-09-30 MED ORDER — ACETAMINOPHEN 650 MG RE SUPP: 650.0000 mg | RECTAL | Status: DC | PRN

## 2015-09-30 MED ORDER — MIDAZOLAM HCL 2 MG/2ML IJ SOLN
INTRAMUSCULAR | Status: AC
  Filled 2015-09-30: qty 2

## 2015-09-30 MED ORDER — ONDANSETRON HCL 4 MG/2ML IJ SOLN
INTRAMUSCULAR | Status: AC
  Filled 2015-09-30: qty 4

## 2015-09-30 MED ORDER — BUPIVACAINE HCL (PF) 0.25 % IJ SOLN
INTRAMUSCULAR | Status: DC | PRN
  Administered 2015-09-30: 10 mL

## 2015-09-30 MED ORDER — ACETAMINOPHEN 325 MG PO TABS: 650.0000 mg | ORAL_TABLET | ORAL | Status: DC | PRN

## 2015-09-30 MED ORDER — DEXAMETHASONE SODIUM PHOSPHATE 10 MG/ML IJ SOLN
INTRAMUSCULAR | Status: AC
  Filled 2015-09-30: qty 1

## 2015-09-30 MED ORDER — SODIUM CHLORIDE 0.9% FLUSH: 3.0000 mL | INTRAVENOUS | Status: DC | PRN

## 2015-09-30 MED ORDER — GLYCOPYRROLATE 0.2 MG/ML IJ SOLN: 0.2000 mg | Freq: Once | INTRAMUSCULAR | Status: DC | PRN

## 2015-09-30 SURGICAL SUPPLY — 47 items
BLADE HEX COATED 2.75 (ELECTRODE) ×2 IMPLANT
BLADE SURG 15 STRL LF DISP TIS (BLADE) ×1 IMPLANT
BLADE SURG 15 STRL SS (BLADE) ×2
CANISTER SUCT 1200ML W/VALVE (MISCELLANEOUS) ×2 IMPLANT
CHLORAPREP W/TINT 26ML (MISCELLANEOUS) ×2 IMPLANT
CLIP TI LARGE 6 (CLIP) IMPLANT
COVER BACK TABLE 60X90IN (DRAPES) ×2 IMPLANT
COVER MAYO STAND STRL (DRAPES) ×2 IMPLANT
DECANTER SPIKE VIAL GLASS SM (MISCELLANEOUS) IMPLANT
DEVICE DUBIN W/COMP PLATE 8390 (MISCELLANEOUS) IMPLANT
DRAPE LAPAROTOMY 100X72 PEDS (DRAPES) ×2 IMPLANT
DRAPE UTILITY XL STRL (DRAPES) ×4 IMPLANT
ELECT REM PT RETURN 9FT ADLT (ELECTROSURGICAL) ×2
ELECTRODE REM PT RTRN 9FT ADLT (ELECTROSURGICAL) ×1 IMPLANT
GLOVE BIO SURGEON STRL SZ 6 (GLOVE) ×2 IMPLANT
GLOVE BIOGEL PI IND STRL 6.5 (GLOVE) ×1 IMPLANT
GLOVE BIOGEL PI INDICATOR 6.5 (GLOVE) ×1
GOWN STRL REUS W/ TWL LRG LVL3 (GOWN DISPOSABLE) ×1 IMPLANT
GOWN STRL REUS W/TWL 2XL LVL3 (GOWN DISPOSABLE) ×2 IMPLANT
GOWN STRL REUS W/TWL LRG LVL3 (GOWN DISPOSABLE) ×2
ILLUMINATOR WAVEGUIDE N/F (MISCELLANEOUS) IMPLANT
KIT MARKER MARGIN INK (KITS) ×1 IMPLANT
LIGHT WAVEGUIDE WIDE FLAT (MISCELLANEOUS) ×1 IMPLANT
LIQUID BAND (GAUZE/BANDAGES/DRESSINGS) ×2 IMPLANT
NDL HYPO 25X1 1.5 SAFETY (NEEDLE) ×1 IMPLANT
NEEDLE HYPO 25X1 1.5 SAFETY (NEEDLE) ×2 IMPLANT
NS IRRIG 1000ML POUR BTL (IV SOLUTION) ×2 IMPLANT
PACK BASIN DAY SURGERY FS (CUSTOM PROCEDURE TRAY) ×2 IMPLANT
PENCIL BUTTON HOLSTER BLD 10FT (ELECTRODE) ×2 IMPLANT
SLEEVE SCD COMPRESS KNEE MED (MISCELLANEOUS) ×2 IMPLANT
SPONGE GAUZE 4X4 12PLY STER LF (GAUZE/BANDAGES/DRESSINGS) ×2 IMPLANT
SPONGE LAP 18X18 X RAY DECT (DISPOSABLE) ×2 IMPLANT
STAPLER VISISTAT 35W (STAPLE) IMPLANT
STRIP CLOSURE SKIN 1/2X4 (GAUZE/BANDAGES/DRESSINGS) ×2 IMPLANT
SUT MON AB 4-0 PC3 18 (SUTURE) ×2 IMPLANT
SUT SILK 2 0 SH (SUTURE) ×2 IMPLANT
SUT VIC AB 2-0 SH 27 (SUTURE) ×2
SUT VIC AB 2-0 SH 27XBRD (SUTURE) IMPLANT
SUT VIC AB 3-0 54X BRD REEL (SUTURE) IMPLANT
SUT VIC AB 3-0 BRD 54 (SUTURE)
SUT VIC AB 3-0 SH 27 (SUTURE) ×2
SUT VIC AB 3-0 SH 27X BRD (SUTURE) ×1 IMPLANT
SYR CONTROL 10ML LL (SYRINGE) ×2 IMPLANT
TOWEL OR 17X24 6PK STRL BLUE (TOWEL DISPOSABLE) ×2 IMPLANT
TOWEL OR NON WOVEN STRL DISP B (DISPOSABLE) ×2 IMPLANT
TUBE CONNECTING 20X1/4 (TUBING) ×2 IMPLANT
YANKAUER SUCT BULB TIP NO VENT (SUCTIONS) ×2 IMPLANT

## 2015-09-30 NOTE — Discharge Instructions (Addendum)
[facility] °Office Phone Number [phone] ° °BREAST BIOPSY/ PARTIAL MASTECTOMY: POST OP INSTRUCTIONS ° °Always review your discharge instruction sheet given to you by the facility where your surgery was performed. ° °IF YOU HAVE DISABILITY OR FAMILY LEAVE FORMS, YOU MUST BRING THEM TO THE OFFICE FOR PROCESSING.  DO NOT GIVE THEM TO YOUR DOCTOR. ° °1. A prescription for pain medication may be given to you upon discharge.  Take your pain medication as prescribed, if needed.  If narcotic pain medicine is not needed, then you may take acetaminophen (Tylenol) or ibuprofen (Advil) as needed. °2. Take your usually prescribed medications unless otherwise directed °3. If you need a refill on your pain medication, please contact your pharmacy.  They will contact our office to request authorization.  Prescriptions will not be filled after 5pm or on week-ends. °4. You should eat very light the first 24 hours after surgery, such as soup, crackers, pudding, etc.  Resume your normal diet the day after surgery. °5. Most patients will experience some swelling and bruising in the breast.  Ice packs and a good support bra will help.  Swelling and bruising can take several days to resolve.  °6. It is common to experience some constipation if taking pain medication after surgery.  Increasing fluid intake and taking a stool softener will usually help or prevent this problem from occurring.  A mild laxative (Milk of Magnesia or Miralax) should be taken according to package directions if there are no bowel movements after 48 hours. °7. Unless discharge instructions indicate otherwise, you may remove your bandages 48 hours after surgery, and you may shower at that time.  You may have steri-strips (small skin tapes) in place directly over the incision.  These strips should be left on the skin for 7-10 days.   Any sutures or staples will be removed at the office during your follow-up visit. °8. ACTIVITIES:  You may resume  regular daily activities (gradually increasing) beginning the next day.  Wearing a good support bra or sports bra (or the breast binder) minimizes pain and swelling.  You may have sexual intercourse when it is comfortable. °a. You may drive when you no longer are taking prescription pain medication, you can comfortably wear a seatbelt, and you can safely maneuver your car and apply brakes. °b. RETURN TO WORK:  __________1 week_______________ °9. You should see your doctor in the office for a follow-up appointment approximately two weeks after your surgery.  Your doctor’s nurse will typically make your follow-up appointment when she calls you with your pathology report.  Expect your pathology report 2-3 business days after your surgery.  You may call to check if you do not hear from us after three days. ° ° °WHEN TO CALL YOUR DOCTOR: °1. Fever over 101.0 °2. Nausea and/or vomiting. °3. Extreme swelling or bruising. °4. Continued bleeding from incision. °5. Increased pain, redness, or drainage from the incision. ° °The clinic staff is available to answer your questions during regular business hours.  Please don’t hesitate to call and ask to speak to one of the nurses for clinical concerns.  If you have a medical emergency, go to the nearest emergency room or call 911.  A surgeon from [facility] is always on call at the hospital. ° °For further questions, please visit [email or web address]  ° ° °Post Anesthesia Home Care Instructions ° °Activity: °Get plenty of rest for the remainder of the day. A responsible adult should stay with you for 24   hours following the procedure.  °For the next 24 hours, DO NOT: °-Drive a car °-Operate machinery °-Drink alcoholic beverages °-Take any medication unless instructed by your physician °-Make any legal decisions or sign important papers. ° °Meals: °Start with liquid foods such as gelatin or soup. Progress to regular foods as tolerated. Avoid greasy, spicy, heavy  foods. If nausea and/or vomiting occur, drink only clear liquids until the nausea and/or vomiting subsides. Call your physician if vomiting continues. ° °Special Instructions/Symptoms: °Your throat may feel dry or sore from the anesthesia or the breathing tube placed in your throat during surgery. If this causes discomfort, gargle with warm salt water. The discomfort should disappear within 24 hours. ° °If you had a scopolamine patch placed behind your ear for the management of post- operative nausea and/or vomiting: ° °1. The medication in the patch is effective for 72 hours, after which it should be removed.  Wrap patch in a tissue and discard in the trash. Wash hands thoroughly with soap and water. °2. You may remove the patch earlier than 72 hours if you experience unpleasant side effects which may include dry mouth, dizziness or visual disturbances. °3. Avoid touching the patch. Wash your hands with soap and water after contact with the patch. °  ° °

## 2015-09-30 NOTE — Anesthesia Procedure Notes (Signed)
Procedure Name: LMA Insertion Date/Time: 09/30/2015 9:24 AM Performed by: Lyndee Leo Pre-anesthesia Checklist: Patient identified, Emergency Drugs available, Suction available and Patient being monitored Patient Re-evaluated:Patient Re-evaluated prior to inductionOxygen Delivery Method: Circle system utilized Preoxygenation: Pre-oxygenation with 100% oxygen Intubation Type: IV induction Ventilation: Mask ventilation without difficulty LMA: LMA inserted LMA Size: 4.0 Number of attempts: 1 Airway Equipment and Method: Bite block Placement Confirmation: positive ETCO2 Tube secured with: Tape Dental Injury: Teeth and Oropharynx as per pre-operative assessment

## 2015-09-30 NOTE — Anesthesia Preprocedure Evaluation (Signed)
Anesthesia Evaluation  Patient identified by MRN, date of birth, ID band Patient awake    Reviewed: Allergy & Precautions, NPO status , Patient's Chart, lab work & pertinent test results  Airway Mallampati: II  TM Distance: >3 FB Neck ROM: Full    Dental no notable dental hx.    Pulmonary neg pulmonary ROS,    Pulmonary exam normal breath sounds clear to auscultation       Cardiovascular negative cardio ROS Normal cardiovascular exam Rhythm:Regular Rate:Normal     Neuro/Psych negative neurological ROS  negative psych ROS   GI/Hepatic negative GI ROS, Neg liver ROS,   Endo/Other  negative endocrine ROS  Renal/GU negative Renal ROS  negative genitourinary   Musculoskeletal negative musculoskeletal ROS (+)   Abdominal   Peds negative pediatric ROS (+)  Hematology negative hematology ROS (+)   Anesthesia Other Findings   Reproductive/Obstetrics negative OB ROS                             Anesthesia Physical Anesthesia Plan  ASA: I  Anesthesia Plan: General   Post-op Pain Management:    Induction: Intravenous  Airway Management Planned: LMA  Additional Equipment:   Intra-op Plan:   Post-operative Plan: Extubation in OR  Informed Consent: I have reviewed the patients History and Physical, chart, labs and discussed the procedure including the risks, benefits and alternatives for the proposed anesthesia with the patient or authorized representative who has indicated his/her understanding and acceptance.   Dental advisory given  Plan Discussed with: CRNA and Surgeon  Anesthesia Plan Comments:         Anesthesia Quick Evaluation  

## 2015-09-30 NOTE — Transfer of Care (Signed)
Immediate Anesthesia Transfer of Care Note  Patient: Emily Harris  Procedure(s) Performed: Procedure(s) with comments: RIGHT BREAST MAJOR DUCT EXCISION (Right) - RIGHT BREAST MAJOR DUCT EXCISION  Patient Location: PACU  Anesthesia Type:General  Level of Consciousness: awake, sedated and patient cooperative  Airway & Oxygen Therapy: Patient Spontanous Breathing and Patient connected to face mask oxygen  Post-op Assessment: Report given to RN and Post -op Vital signs reviewed and stable  Post vital signs: Reviewed and stable  Last Vitals:  Vitals:   09/30/15 0747  BP: (!) 141/88  Pulse: 98  Resp: 20  Temp: 36.6 C    Last Pain: There were no vitals filed for this visit.       Complications: No apparent anesthesia complications

## 2015-09-30 NOTE — Interval H&P Note (Signed)
History and Physical Interval Note:  09/30/2015 9:06 AM  Emily Harris  has presented today for surgery, with the diagnosis of RIGHT BREAST BLOODY NIPPLE DISCHARGE  The various methods of treatment have been discussed with the patient and family. After consideration of risks, benefits and other options for treatment, the patient has consented to  Procedure(s): RIGHT BREAST MAJOR DUCT EXCISION (Right) as a surgical intervention .  The patient's history has been reviewed, patient examined, no change in status, stable for surgery.  I have reviewed the patient's chart and labs.  Questions were answered to the patient's satisfaction.     Yasseen Salls

## 2015-09-30 NOTE — Anesthesia Postprocedure Evaluation (Signed)
Anesthesia Post Note  Patient: Emily Harris  Procedure(s) Performed: Procedure(s) (LRB): RIGHT BREAST MAJOR DUCT EXCISION (Right)  Patient location during evaluation: PACU Anesthesia Type: General Level of consciousness: awake and alert Pain management: pain level controlled Vital Signs Assessment: post-procedure vital signs reviewed and stable Respiratory status: spontaneous breathing, nonlabored ventilation, respiratory function stable and patient connected to nasal cannula oxygen Cardiovascular status: blood pressure returned to baseline and stable Postop Assessment: no signs of nausea or vomiting Anesthetic complications: no    Last Vitals:  Vitals:   09/30/15 1030 09/30/15 1058  BP: (!) 132/92 126/90  Pulse: 96 94  Resp: 16 18  Temp:  36.6 C    Last Pain:  Vitals:   09/30/15 1058  PainSc: 0-No pain                 Hennie Gosa S

## 2015-09-30 NOTE — Op Note (Signed)
Right major duct excision  Indications: This patient presents with history of right bloody nipple discharge and abnormal ultrasound  Pre-operative Diagnosis: right bloody nipple discharge  Post-operative Diagnosis: right bloody nipple discharge  Surgeon: Stark Klein   Anesthesia: General LMA anesthesia  ASA Class: 1  Procedure Details  The patient was seen in the Holding Room. The risks, benefits, complications, treatment options, and expected outcomes were discussed with the patient. The possibilities of reaction to medication, pulmonary aspiration, bleeding, infection, the need for additional procedures, failure to diagnose a condition, and creating a complication requiring transfusion or operation were discussed with the patient. The patient concurred with the proposed plan, giving informed consent.  The site of surgery properly noted/marked. The patient was taken to Operating Room # 2, identified, and the procedure verified as Breast Excisional Biopsy. A Time Out was held and the above information confirmed.  After induction of anesthesia, the right  breast and chest were prepped and draped in standard fashion. The duct with the discharge was cannulated with a lacrimal duct probe.  A circumareolar incision was made over the  lower nipple .  Dissection was carried down around the probe. The specimen was marked with the margin marker paint kit.  Hemostasis was achieved with cautery.  The wound was irrigated and closed with a 3-0 Vicryl interrupted deep dermal stitch and a 4-0 Monocryl subcuticular closure in layers.    Sterile dressings were applied. At the end of the operation, all sponge, instrument, and needle counts were correct.  Findings: grossly clear surgical margins  Estimated Blood Loss:  Minimal           Specimens: right major duct excision         Complications:  None; patient tolerated the procedure well.         Disposition: PACU - hemodynamically stable.      Condition: stable

## 2015-10-02 ENCOUNTER — Encounter (HOSPITAL_BASED_OUTPATIENT_CLINIC_OR_DEPARTMENT_OTHER): Payer: Self-pay | Admitting: General Surgery

## 2015-10-05 NOTE — Progress Notes (Signed)
Please let patient know pathology is benign.

## 2015-11-19 ENCOUNTER — Ambulatory Visit: Payer: BLUE CROSS/BLUE SHIELD | Admitting: Obstetrics & Gynecology

## 2015-11-19 NOTE — Progress Notes (Deleted)
45 y.o. G0P0 Single African American F here for annual exam.    No LMP recorded.          Sexually active: {yes no:314532}  The current method of family planning is {contraception:315051}.    Exercising: {yes no:314532}  {types:19826} Smoker:  no  Health Maintenance: Pap:  06/02/15 ASCUS, HR HPV negative  History of abnormal Pap:  yes MMG:  08/26/15 MMG & [place] BIRADS 4 suspicious- planning duct excision  Colonoscopy:  never BMD:   never TDaP:  2009  Pneumonia vaccine(s):  *** Zostavax:   *** Hep C testing: not indicated  Screening Labs: ***, Hb today: ***, Urine today: ***   reports that she has never smoked. She has never used smokeless tobacco. She reports that she drinks about 0.6 oz of alcohol per week . She reports that she does not use drugs.  Past Medical History:  Diagnosis Date  . Abnormal Pap smear, low grade squamous intraepithelial lesion (LGSIL) 05/27/2002   colpo bx. Chronic cervicitis  . Abnormal Pap smear, low grade squamous intraepithelial lesion (LGSIL) 11/22/2002   no colpo  . Abnormal Pap smear, low grade squamous intraepithelial lesion (LGSIL) 05/30/2003   no colpo  . Dysmenorrhea   . Fibroids   . Menorrhagia     Past Surgical History:  Procedure Laterality Date  . BREAST DUCTAL SYSTEM EXCISION Right 09/30/2015   Procedure: RIGHT BREAST MAJOR DUCT EXCISION;  Surgeon: Stark Klein, MD;  Location: [facility];  Service: General;  Laterality: Right;  RIGHT BREAST MAJOR DUCT EXCISION  . COLPOSCOPY W/ BIOPSY / CURETTAGE  2011   Chroni cervicitis w/o  dysplasia  . Hysteroscopic myomectomy  2010   Dr. Kerin Perna  . MYOMECTOMY ABDOMINAL APPROACH  01/01/13   [facility]    Current Outpatient Prescriptions  Medication Sig Dispense Refill  . Ascorbic Acid (VITAMIN C PO) Take by mouth. Take 2 daily    . fluticasone (FLONASE) 50 MCG/ACT nasal spray Place 2 sprays into both nostrils daily. 16 g 0  . IRON PO Take by mouth.    . Multiple  Vitamin (MULTIVITAMIN WITH MINERALS) TABS Take 1 tablet by mouth daily. Reported on 06/02/2015    . naproxen sodium (ANAPROX) 220 MG tablet Take 220 mg by mouth as needed.    . pseudoephedrine (SUDAFED) 60 MG tablet Take 1 tablet (60 mg total) by mouth every 6 (six) hours as needed for congestion. 30 tablet 0  . traMADol (ULTRAM) 50 MG tablet Take 1-2 tablets (50-100 mg total) by mouth every 6 (six) hours as needed for moderate pain or severe pain. 10 tablet 0   No current facility-administered medications for this visit.     Family History  Problem Relation Age of Onset  . Diabetes Mother   . Hypertension Mother   . Breast cancer Mother   . Cancer Mother   . Hypertension Father   . Cancer Maternal Grandmother     leukemia  . Diabetes Maternal Grandmother   . Heart failure Maternal Grandfather   . Heart disease Maternal Grandfather   . Breast cancer Cousin 44    07/2013  . Heart disease Cousin   . Breast cancer Maternal Aunt     late 30's  . Diabetes Paternal Grandmother     ROS:  Pertinent items are noted in HPI.  Otherwise, a comprehensive ROS was negative.  Exam:   There were no vitals taken for this visit.  Weight change: @WEIGHTCHANGE @ Height:  Ht Readings from Last 3 Encounters:  09/30/15 5\' 5"  (1.651 m)  09/04/15 5\' 7"  (1.702 m)  08/26/15 5\' 7"  (1.702 m)    General appearance: alert, cooperative and appears stated age Head: Normocephalic, without obvious abnormality, atraumatic Neck: no adenopathy, supple, symmetrical, trachea midline and thyroid {EXAM; THYROID:18604} Lungs: clear to auscultation bilaterally Breasts: {Exam; breast:13139::"normal appearance, no masses or tenderness"} Heart: regular rate and rhythm Abdomen: soft, non-tender; bowel sounds normal; no masses,  no organomegaly Extremities: extremities normal, atraumatic, no cyanosis or edema Skin: Skin color, texture, turgor normal. No rashes or lesions Lymph nodes: Cervical, supraclavicular, and  axillary nodes normal. No abnormal inguinal nodes palpated Neurologic: Grossly normal   Pelvic: External genitalia:  no lesions              Urethra:  normal appearing urethra with no masses, tenderness or lesions              Bartholins and Skenes: normal                 Vagina: normal appearing vagina with normal color and discharge, no lesions              Cervix: {exam; cervix:14595}              Pap taken: {yes no:314532} Bimanual Exam:  Uterus:  {exam; uterus:12215}              Adnexa: {exam; adnexa:12223}               Rectovaginal: Confirms               Anus:  normal sphincter tone, no lesions  Chaperone was present for exam.  A:  Well Woman with normal exam  P:   {plan; gyn:5269::"mammogram","pap smear","return annually or prn"}

## 2015-12-08 NOTE — Progress Notes (Signed)
45 y.o. G0P0 SingleAfrican AmericanF here for annual exam.  Doing well.  Traveling to see family tomorrow.  Cycles are regular.  Flow is not heavy.  H/O HGSIL pap 5/16 with colpo 5/16 with +ECC.  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    Patient's last menstrual period was 11/26/2015.          Sexually active: No.  The current method of family planning is abstinence.    Exercising: No.  The patient does not participate in regular exercise at present. Smoker:  no  Health Maintenance: Pap:  06/02/15 ASCUS. HR HPV:neg  History of abnormal Pap:  Yes, LEEP 06/2014  MMG:  08/26/15 [place] right BIRADS4:[place]. 09/30/15 RIGHT BREAST MAJOR DUCT EXCISION TDaP:  2009 Screening Labs: Here, Hb today: 13.2, Urine today: Negative   reports that she has never smoked. She has never used smokeless tobacco. She reports that she drinks about 0.6 oz of alcohol per week . She reports that she does not use drugs.  Past Medical History:  Diagnosis Date  . Abnormal Pap smear, low grade squamous intraepithelial lesion (LGSIL) 05/27/2002   colpo bx. Chronic cervicitis  . Abnormal Pap smear, low grade squamous intraepithelial lesion (LGSIL) 11/22/2002   no colpo  . Abnormal Pap smear, low grade squamous intraepithelial lesion (LGSIL) 05/30/2003   no colpo  . Dysmenorrhea   . Fibroids   . Menorrhagia     Past Surgical History:  Procedure Laterality Date  . BREAST DUCTAL SYSTEM EXCISION Right 09/30/2015   Procedure: RIGHT BREAST MAJOR DUCT EXCISION;  Surgeon: Stark Klein, MD;  Location: [facility];  Service: General;  Laterality: Right;  RIGHT BREAST MAJOR DUCT EXCISION  . COLPOSCOPY W/ BIOPSY / CURETTAGE  2011   Chroni cervicitis w/o  dysplasia  . Hysteroscopic myomectomy  2010   Dr. Kerin Perna  . MYOMECTOMY ABDOMINAL APPROACH  01/01/13   [facility]    Current Outpatient Prescriptions  Medication Sig Dispense Refill  . Ascorbic Acid (VITAMIN C PO) Take by mouth.  Take 2 daily    . fluticasone (FLONASE) 50 MCG/ACT nasal spray Place 2 sprays into both nostrils daily. 16 g 0  . IRON PO Take by mouth.    . Multiple Vitamin (MULTIVITAMIN WITH MINERALS) TABS Take 1 tablet by mouth daily. Reported on 06/02/2015    . naproxen sodium (ANAPROX) 220 MG tablet Take 220 mg by mouth as needed.     No current facility-administered medications for this visit.     Family History  Problem Relation Age of Onset  . Diabetes Mother   . Hypertension Mother   . Breast cancer Mother   . Cancer Mother   . Hypertension Father   . Cancer Maternal Grandmother     leukemia  . Diabetes Maternal Grandmother   . Heart failure Maternal Grandfather   . Heart disease Maternal Grandfather   . Breast cancer Cousin 44    07/2013  . Heart disease Cousin   . Breast cancer Maternal Aunt     late 30's  . Diabetes Paternal Grandmother     ROS:  Pertinent items are noted in HPI.  Otherwise, a comprehensive ROS was negative.  Exam:   BP 130/86 (BP Location: Right Arm, Patient Position: Sitting, Cuff Size: Large)   Pulse 92   Resp 20   Ht _0  (1.651 m)   Wt 181 lb (82.1 kg)   LMP 11/26/2015   BMI 30.12 kg/m   Weight change: stable  Height: _0  (165.1 cm)  Ht Readings from Last 3 Encounters:  12/09/15 _1  (1.651 m)  09/30/15 _2  (1.651 m)  09/04/15 _3  (1.702 m)   General appearance: alert, cooperative and appears stated age Head: Normocephalic, without obvious abnormality, atraumatic Neck: no adenopathy, supple, symmetrical, trachea midline and thyroid enlarged symmetrically  Lungs: clear to auscultation bilaterally Breasts: normal appearance, no masses or tenderness Heart: regular rate and rhythm Abdomen: soft, non-tender; bowel sounds normal; no masses,  no organomegaly Extremities: extremities normal, atraumatic, no cyanosis or edema Skin: Skin color, texture, turgor normal. No rashes or lesions Lymph nodes: Cervical, supraclavicular, and axillary nodes  normal. No abnormal inguinal nodes palpated Neurologic: Grossly normal   Pelvic: External genitalia:  no lesions              Urethra:  normal appearing urethra with no masses, tenderness or lesions              Bartholins and Skenes: normal                 Vagina: normal appearing vagina with normal color and discharge, no lesions              Cervix: no lesions              Pap taken: Yes.   Bimanual Exam:  Uterus:  normal size, contour, position, consistency, mobility, non-tender              Adnexa: normal adnexa and no mass, fullness, tenderness               Rectovaginal: Confirms               Anus:  normal sphincter tone, no lesions  Chaperone was present for exam.  A:     Well Woman with normal exam H/O uterine fibroids, s/p myomectomy x 2, 03-26-08 with Dr. 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 Enlarged thyroid noted on exam today  P: Mammogram yearly.  Pt doing 3D MMG Pap and HR HPV obtained today. TSH and Vit D today Gonorrhea and chlamydia testing today Hep B, HIV, RPR Thyroid ultrasound will be scheduled for pt AEX 1 year or follow up prn

## 2015-12-09 ENCOUNTER — Encounter: Payer: Self-pay | Admitting: Obstetrics & Gynecology

## 2015-12-09 ENCOUNTER — Ambulatory Visit (INDEPENDENT_AMBULATORY_CARE_PROVIDER_SITE_OTHER): Payer: BLUE CROSS/BLUE SHIELD | Admitting: Obstetrics & Gynecology

## 2015-12-09 ENCOUNTER — Ambulatory Visit
Admission: RE | Admit: 2015-12-09 | Discharge: 2015-12-09 | Disposition: A | Discharge status: Home / Self Care | Payer: BLUE CROSS/BLUE SHIELD | Source: Ambulatory Visit | Attending: Obstetrics & Gynecology | Admitting: Obstetrics & Gynecology

## 2015-12-09 VITALS — BP 130/86 | HR 92 | Resp 20 | Ht 65.0 in | Wt 181.0 lb

## 2015-12-09 DIAGNOSIS — Z0000 Encounter for general adult medical examination without abnormal findings: Secondary | ICD-10-CM

## 2015-12-09 DIAGNOSIS — Z202 Contact with and (suspected) exposure to infections with a predominantly sexual mode of transmission: Secondary | ICD-10-CM | POA: Diagnosis not present

## 2015-12-09 DIAGNOSIS — Z124 Encounter for screening for malignant neoplasm of cervix: Secondary | ICD-10-CM | POA: Diagnosis not present

## 2015-12-09 DIAGNOSIS — Z01419 Encounter for gynecological examination (general) (routine) without abnormal findings: Secondary | ICD-10-CM

## 2015-12-09 DIAGNOSIS — N871 Moderate cervical dysplasia: Secondary | ICD-10-CM

## 2015-12-09 DIAGNOSIS — E049 Nontoxic goiter, unspecified: Secondary | ICD-10-CM | POA: Diagnosis not present

## 2015-12-09 LAB — POCT URINALYSIS DIPSTICK
BILIRUBIN UA: NEGATIVE
Glucose, UA: NEGATIVE
Ketones, UA: NEGATIVE
LEUKOCYTES UA: NEGATIVE
NITRITE UA: NEGATIVE
PH UA: 5
PROTEIN UA: NEGATIVE
RBC UA: NEGATIVE
UROBILINOGEN UA: NEGATIVE

## 2015-12-09 LAB — TSH: TSH: 1.68 mIU/L

## 2015-12-09 NOTE — Progress Notes (Signed)
Scheduled patient while in office for thyroid ultrasound at [place] [address]  for today 12/09/2015 at 1:45 pm with 1:25 pm arrival. Patient is agreeable to date and time. Placed in imaging hold.

## 2015-12-10 LAB — STD PANEL
HEP B S AG: NEGATIVE
HIV: NONREACTIVE

## 2015-12-10 LAB — VITAMIN D 25 HYDROXY (VIT D DEFICIENCY, FRACTURES): Vit D, 25-Hydroxy: 61 ng/mL (ref 30–100)

## 2015-12-11 LAB — PAP IG AND HPV HIGH-RISK: HPV DNA High Risk: NOT DETECTED

## 2015-12-11 LAB — GC/CHLAMYDIA PROBE AMP, TP VIAL
CHLAMYDIA PROBE AMP: NOT DETECTED
GC PROBE AMP: NOT DETECTED

## 2015-12-14 ENCOUNTER — Telehealth: Payer: Self-pay

## 2015-12-14 DIAGNOSIS — E041 Nontoxic single thyroid nodule: Secondary | ICD-10-CM

## 2015-12-14 LAB — HEMOGLOBIN, FINGERSTICK: Hemoglobin, fingerstick: 13.2 g/dL (ref 12.0–16.0)

## 2015-12-14 NOTE — Telephone Encounter (Signed)
Spoke with patient. Advised of results as seen below from [place]. Patient is agreeable and verbalizes understanding. Aex scheduled for 12/13/2016 at 9:15 am with Dr.Miller. Patient is agreeable to date and time. 08 recall placed.  Routing to provider for final review. Patient agreeable to disposition. Will close encounter.

## 2015-12-14 NOTE — Telephone Encounter (Signed)
Left message to call Shonna Deiter at [phone]. 

## 2015-12-14 NOTE — Telephone Encounter (Signed)
Spoke with patient. Advised of results as seen below from [place]. Patient verbalizes understanding. Aware I will contact [place] regarding thyroid biopsy scheduling and return call with appointment date and time.  US Thyroid Biopsy scheduled for 12/15/2015 at 2 pm with [facility] imaging [place]. Patient has been notified and is agreeable to date and time. Continued imaging hold.  Routing to provider for final review. Patient agreeable to disposition. Will close encounter.

## 2015-12-14 NOTE — Telephone Encounter (Signed)
-----   Message from Megan Salon, MD sent at 12/13/2015  7:34 AM EST ----- Inform pap her pap was normal and HR HPV normal.  Pt can be removed from recall.  Needs repeat pap and HR HPV 1 yr.  08 recall.  Please make sure appt scheduled for 1 year for AEX.  Also, inform pt GC, Chl, HIV, RPR,  Hep B negative.  Asa well, Vit D and TSH are normal.    I'm also sending you her thyroid ultrasound result.    CC:  Karmen Bongo

## 2015-12-14 NOTE — Telephone Encounter (Signed)
-----   Message from Megan Salon, MD sent at 12/13/2015  7:44 AM EST ----- Please let pt know her thyroid is enlarged with nodules.  There is a 1.1cm nodule that is solid with calcifications that needs to be biopsied.  The other nodules to not meet criteria for being biopsied.  Please schedule.  Out of imaging hold.

## 2015-12-15 ENCOUNTER — Ambulatory Visit
Admission: RE | Admit: 2015-12-15 | Discharge: 2015-12-15 | Disposition: A | Discharge status: Home / Self Care | Payer: BLUE CROSS/BLUE SHIELD | Source: Ambulatory Visit | Attending: Obstetrics & Gynecology | Admitting: Obstetrics & Gynecology

## 2015-12-15 ENCOUNTER — Other Ambulatory Visit (HOSPITAL_COMMUNITY)
Admission: RE | Admit: 2015-12-15 | Discharge: 2015-12-15 | Disposition: A | Discharge status: Home / Self Care | Payer: BLUE CROSS/BLUE SHIELD | Source: Ambulatory Visit | Attending: Radiology | Admitting: Radiology

## 2015-12-15 DIAGNOSIS — E041 Nontoxic single thyroid nodule: Secondary | ICD-10-CM | POA: Insufficient documentation

## 2015-12-22 ENCOUNTER — Other Ambulatory Visit: Payer: Self-pay | Admitting: Obstetrics & Gynecology

## 2015-12-22 DIAGNOSIS — E041 Nontoxic single thyroid nodule: Secondary | ICD-10-CM

## 2015-12-23 ENCOUNTER — Telehealth: Payer: Self-pay

## 2015-12-23 NOTE — Telephone Encounter (Signed)
-----   Message from Megan Salon, MD sent at 12/22/2015  5:56 PM EST ----- Called pt personally.  Aware biopsy does not look like there is adequate sampling.  I also spoke with Dr. Vernard Gambles (radiologist who read initial thyroid ultrasound).  Pt is aware she needs to either have a surgical consultation or have a repeat thyroid biopsy.  She does NOT want to have another biopsy if at all possible.  Please call [facility] and make appt with Dr. Harlow Asa for consultation please.  Referral has been placed but I want to make sure this is done in a timely fashion due to the worrisome finding on her thyroid ultrasound.  Ok to remove from imaging hold.

## 2015-12-29 NOTE — Telephone Encounter (Signed)
Patient has been scheduled with Dr Barry Dienes at [facility] on 01/25/16. Patient is aware of appointment date and time. She was notified by [facility] (please see referral note).  Routing to [place] for review prior to closing encounter.

## 2016-01-01 NOTE — Addendum Note (Signed)
Addended by: Megan Salon on: 01/01/2016 05:48 PM   Modules accepted: Orders

## 2016-04-09 ENCOUNTER — Ambulatory Visit (INDEPENDENT_AMBULATORY_CARE_PROVIDER_SITE_OTHER): Payer: Self-pay | Admitting: Emergency Medicine

## 2016-04-09 ENCOUNTER — Ambulatory Visit (INDEPENDENT_AMBULATORY_CARE_PROVIDER_SITE_OTHER): Payer: Self-pay

## 2016-04-09 VITALS — BP 122/84 | HR 81 | Temp 98.9°F | Resp 16 | Ht 66.0 in | Wt 186.0 lb

## 2016-04-09 DIAGNOSIS — S39012A Strain of muscle, fascia and tendon of lower back, initial encounter: Secondary | ICD-10-CM

## 2016-04-09 DIAGNOSIS — M545 Low back pain, unspecified: Secondary | ICD-10-CM

## 2016-04-09 MED ORDER — CYCLOBENZAPRINE HCL 10 MG PO TABS: 10.0000 mg | ORAL_TABLET | Freq: Three times a day (TID) | ORAL | 0 refills | Status: DC | PRN

## 2016-04-09 MED ORDER — DICLOFENAC SODIUM 75 MG PO TBEC: 75.0000 mg | DELAYED_RELEASE_TABLET | Freq: Two times a day (BID) | ORAL | 0 refills | Status: AC

## 2016-04-09 NOTE — Patient Instructions (Addendum)
     IF you received an x-ray today, you will receive an invoice from [facility]. Please contact [facility] at [phone] with questions or concerns regarding your invoice.   IF you received labwork today, you will receive an invoice from [facility]. Please contact [facility] at [phone] with questions or concerns regarding your invoice.   Our billing staff will not be able to assist you with questions regarding bills from these companies.  You will be contacted with the lab results as soon as they are available. The fastest way to get your results is to activate your My Chart account. Instructions are located on the last page of this paperwork. If you have not heard from [place] regarding the results in 2 weeks, please contact this office.      Back Pain, Adult Back pain is very common. The pain often gets better over time. The cause of back pain is usually not dangerous. Most people can learn to manage their back pain on their own. Follow these instructions at home: Watch your back pain for any changes. The following actions may help to lessen any pain you are feeling:  Stay active. Start with short walks on flat ground if you can. Try to walk farther each day.  Exercise regularly as told by your doctor. Exercise helps your back heal faster. It also helps avoid future injury by keeping your muscles strong and flexible.  Do not sit, drive, or stand in one place for more than 30 minutes.  Do not stay in bed. Resting more than 1-2 days can slow down your recovery.  Be careful when you bend or lift an object. Use good form when lifting:  Bend at your knees.  Keep the object close to your body.  Do not twist.  Sleep on a firm mattress. Lie on your side, and bend your knees. If you lie on your back, put a pillow under your knees.  Take medicines only as told by your doctor.  Put ice on the injured area.  Put ice in a plastic bag.  Place a towel between your  skin and the bag.  Leave the ice on for 20 minutes, 2-3 times a day for the first 2-3 days. After that, you can switch between ice and heat packs.  Avoid feeling anxious or stressed. Find good ways to deal with stress, such as exercise.  Maintain a healthy weight. Extra weight puts stress on your back. Contact a doctor if:  You have pain that does not go away with rest or medicine.  You have worsening pain that goes down into your legs or buttocks.  You have pain that does not get better in one week.  You have pain at night.  You lose weight.  You have a fever or chills. Get help right away if:  You cannot control when you poop (bowel movement) or pee (urinate).  Your arms or legs feel weak.  Your arms or legs lose feeling (numbness).  You feel sick to your stomach (nauseous) or throw up (vomit).  You have belly (abdominal) pain.  You feel like you may pass out (faint). This information is not intended to replace advice given to you by your health care provider. Make sure you discuss any questions you have with your health care provider. Document Released: 06/22/2007 Document Revised: 06/11/2015 Document Reviewed: 05/07/2013 Elsevier Interactive Patient Education  2017 [employer].
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
## 2016-11-23 ENCOUNTER — Telehealth: Payer: Self-pay | Admitting: Obstetrics & Gynecology

## 2016-11-23 NOTE — Telephone Encounter (Signed)
Left patient a message to call back to reschedule a future appointment that was cancelled by the provider for an AEX. °

## 2016-12-13 ENCOUNTER — Ambulatory Visit: Payer: BLUE CROSS/BLUE SHIELD | Admitting: Obstetrics & Gynecology

## 2017-01-08 IMAGING — US US THYROID
1 series · 13 of 25 positions shown · non-contrast
Comparison: None.

CLINICAL DATA: Thyromegaly

EXAM:
THYROID ULTRASOUND
TECHNIQUE: Ultrasound examination of the thyroid gland and adjacent soft
tissues was performed.

[Series 1: us thyroid · 0.08mm/px · 13 of 47 slices shown]
[im 1/47]
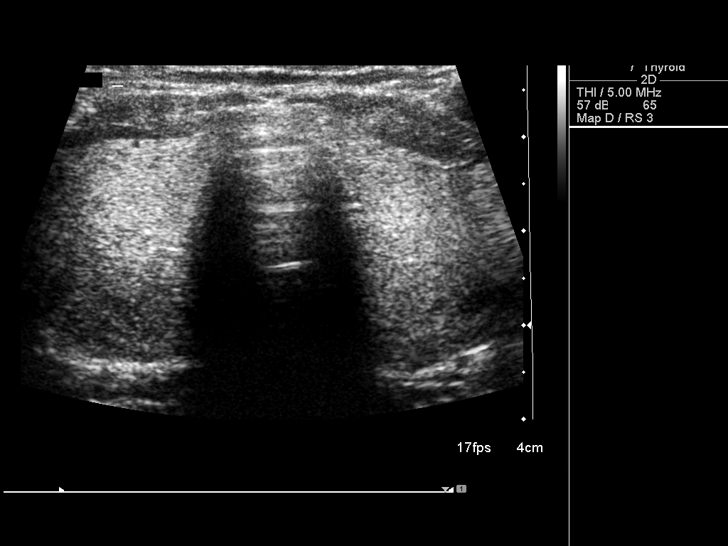
[im 4/47]
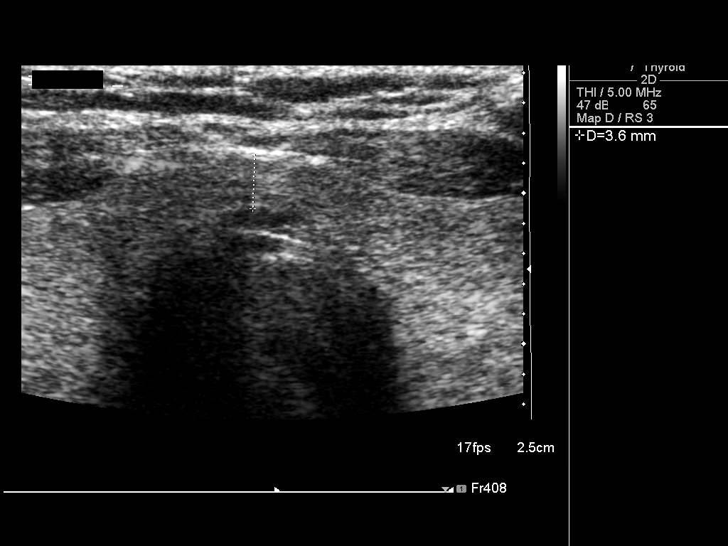
[im 8/47]
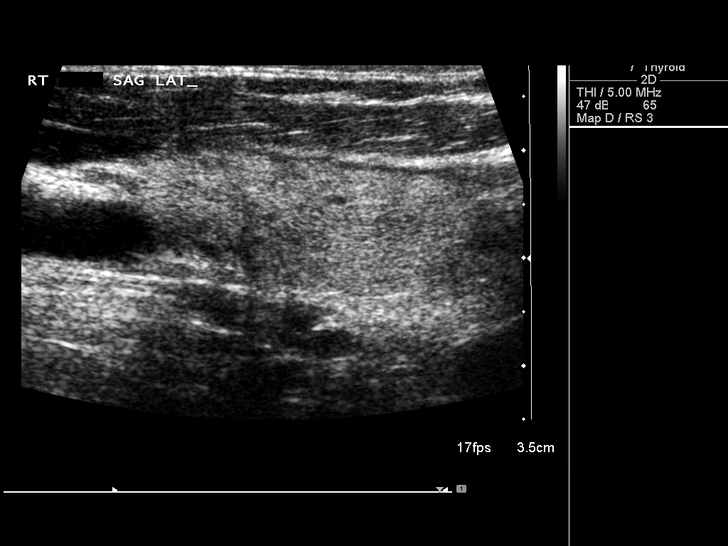
[im 12/47]
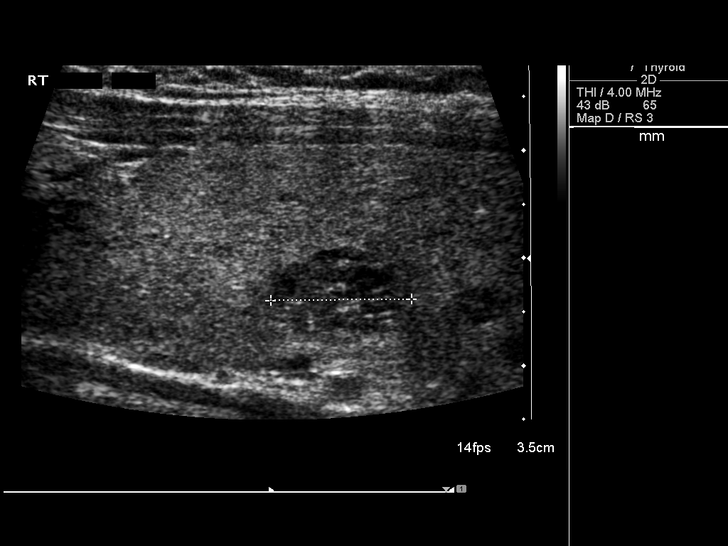
[im 16/47]
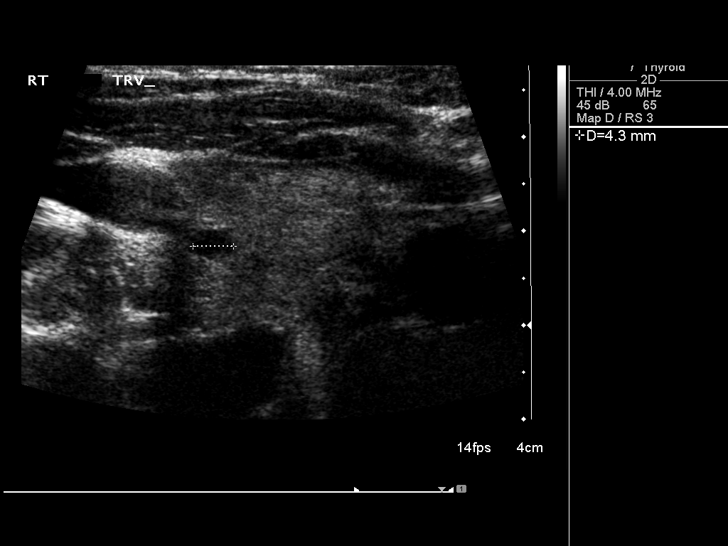
[im 20/47]
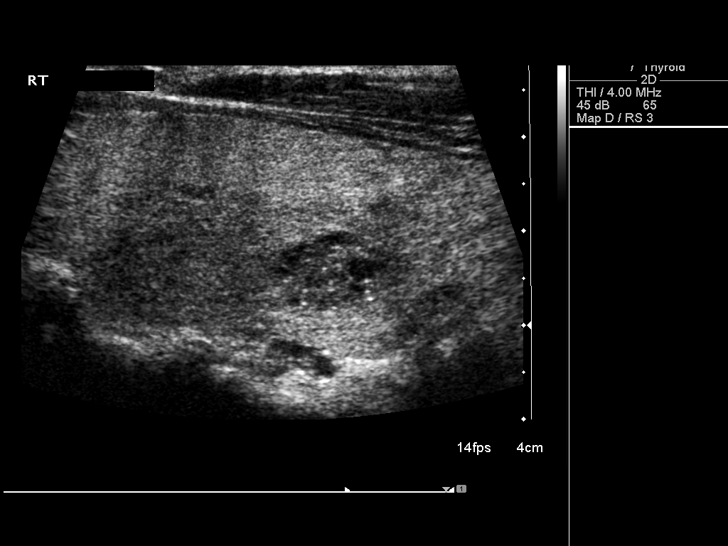
[im 24/47]
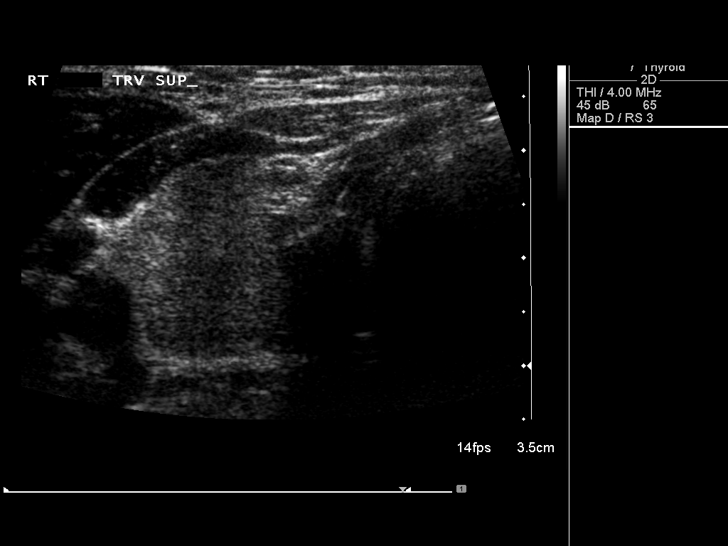
[im 27/47]
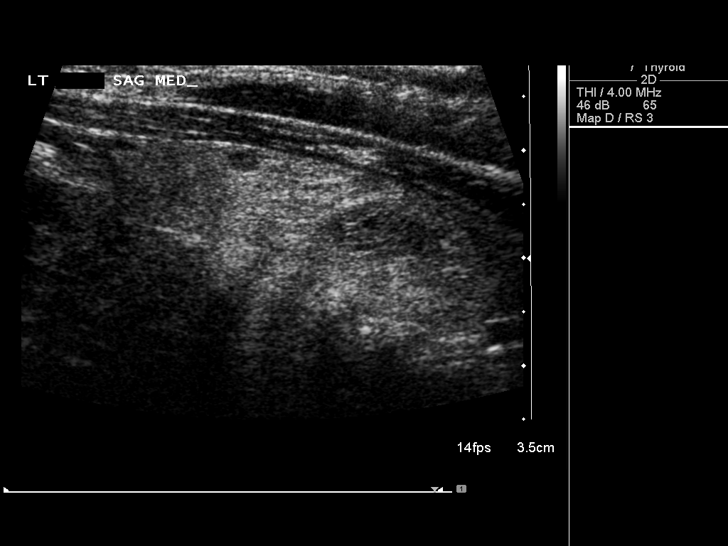
[im 31/47]
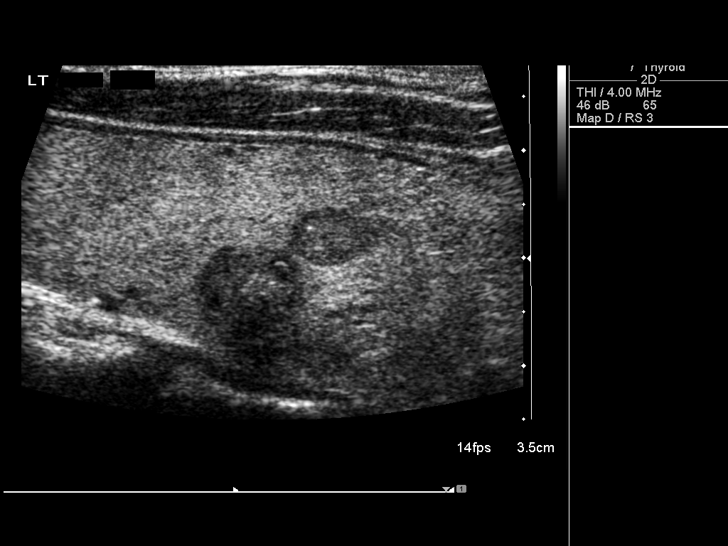
[im 35/47]
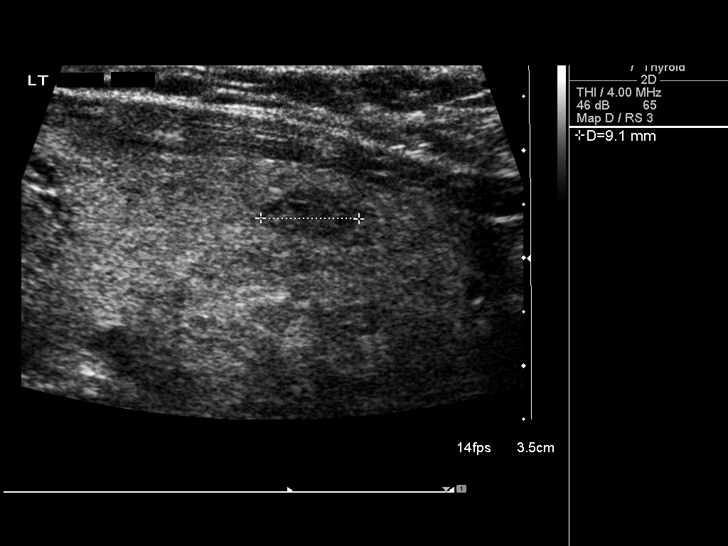
[im 39/47]
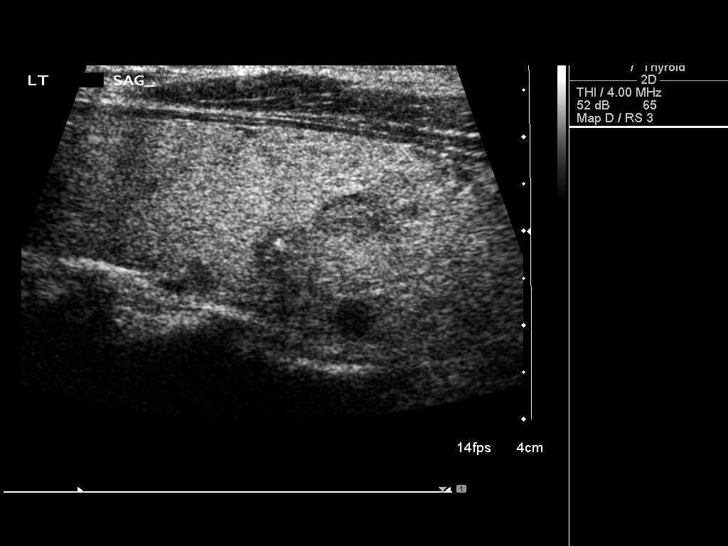
[im 43/47]
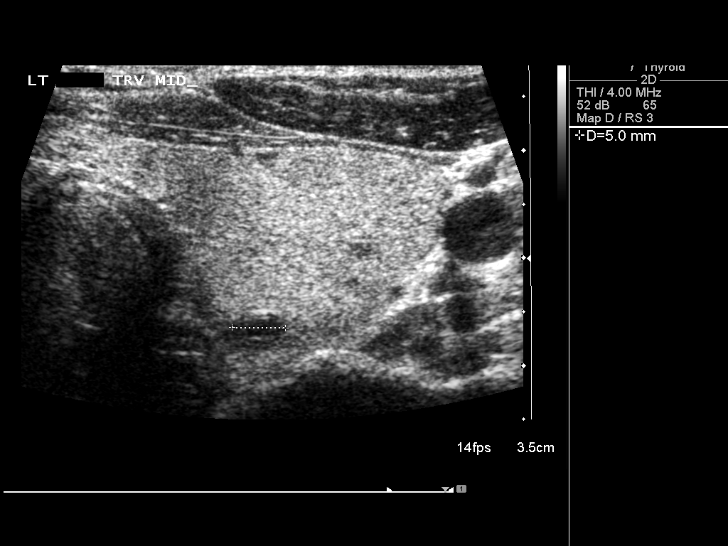
[im 47/47]
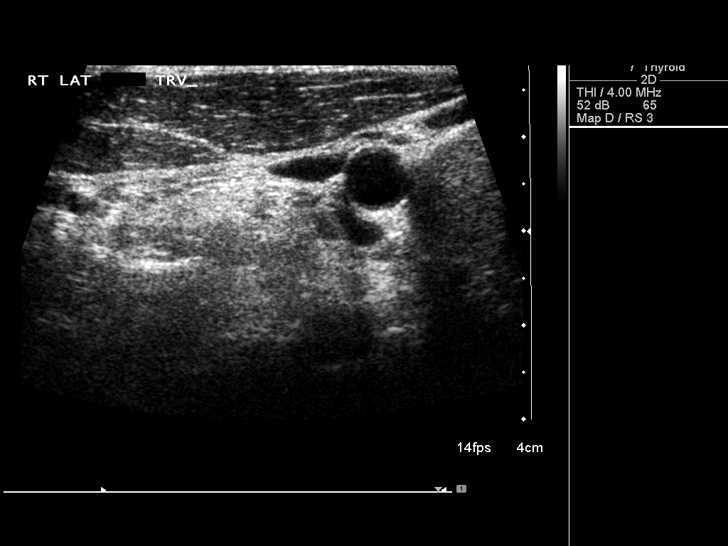

[13 of 25 positions shown; findings below may reference images not displayed]

FINDINGS: Parenchymal Echotexture: Mildly heterogenous

Estimated total number of nodules >/= 1 cm: 3

Number of spongiform nodules >/=  2 cm not described below (TR1): 0

Number of mixed cystic and solid nodules >/= 1.5 cm not described
below (TR2): 0

_________________________________________________________

Isthmus: 0.4 cm thickness

No discrete nodules are identified within the thyroid isthmus.

_________________________________________________________

Right lobe: 7.1 x 2.4 x 2.8 cm

Nodule # 1:

Location: Right; Superior

Size: 1.3 x 1 x 1.3 cm

Composition: solid/almost completely solid (2)

Echogenicity: isoechoic (1)

Shape: not taller-than-wide (0)

Margins: ill-defined (0)

Echogenic foci: none (0)

ACR TI-RADS total points: 3.

ACR TI-RADS risk category: TR3 (3 points).

ACR TI-RADS recommendations:

Given size (<1.4 cm) and appearance, this nodule does NOT meet
TI-RADS criteria for biopsy or dedicated follow-up.

There is a 1.3 x 0.9 cm spongiform nodule, mid lobe.

There is a 0.9 cm hypoechoic/cystic nodule, inferior pole.

_________________________________________________________

Left lobe: 6.4 x 2.6 x 2.6 cm

Nodule # 1:

Location: Left; Mid

Size: 1 x 1.1 x 0.8 cm

Composition: solid/almost completely solid (2)

Echogenicity: hypoechoic (2)

Shape: taller-than-wide (3)

Margins: ill-defined (0)

Echogenic foci: macrocalcifications (1)

ACR TI-RADS total points: 8.

ACR TI-RADS risk category: TR5 (>/= 7 points).

ACR TI-RADS recommendations:

**Given size (>/= 1.0 cm) and appearance, fine needle aspiration of
this highly suspicious nodule should be considered based on TI-RADS
criteria.

There is an adjacent 0.8 cm hypoechoic nodule with punctate
microcalcification.

Medially there is a 9 mm hypoechoic nodule without
microcalcifications.
IMPRESSION: 1. Mild thyromegaly with bilateral nodules.
2. Recommend FNA biopsy of 1.1 cm highly suspicious left mid nodule.

The above is in keeping with the ACR TI-RADS recommendations - [HOSPITAL] 7290;[DATE].

## 2017-01-18 ENCOUNTER — Ambulatory Visit: Payer: Self-pay | Admitting: Obstetrics & Gynecology

## 2017-03-31 ENCOUNTER — Encounter: Payer: Self-pay | Admitting: Obstetrics & Gynecology

## 2017-03-31 ENCOUNTER — Ambulatory Visit (INDEPENDENT_AMBULATORY_CARE_PROVIDER_SITE_OTHER): Payer: Self-pay | Admitting: Obstetrics & Gynecology

## 2017-03-31 ENCOUNTER — Other Ambulatory Visit (HOSPITAL_COMMUNITY)
Admission: RE | Admit: 2017-03-31 | Discharge: 2017-03-31 | Disposition: A | Discharge status: Home / Self Care | Payer: Self-pay | Source: Ambulatory Visit | Attending: Obstetrics & Gynecology | Admitting: Obstetrics & Gynecology

## 2017-03-31 VITALS — BP 136/86 | HR 86 | Resp 16 | Ht 65.5 in | Wt 182.0 lb

## 2017-03-31 DIAGNOSIS — Z23 Encounter for immunization: Secondary | ICD-10-CM

## 2017-03-31 DIAGNOSIS — Z1211 Encounter for screening for malignant neoplasm of colon: Secondary | ICD-10-CM

## 2017-03-31 DIAGNOSIS — E049 Nontoxic goiter, unspecified: Secondary | ICD-10-CM

## 2017-03-31 DIAGNOSIS — Z124 Encounter for screening for malignant neoplasm of cervix: Secondary | ICD-10-CM | POA: Insufficient documentation

## 2017-03-31 DIAGNOSIS — N871 Moderate cervical dysplasia: Secondary | ICD-10-CM

## 2017-03-31 DIAGNOSIS — Z01419 Encounter for gynecological examination (general) (routine) without abnormal findings: Secondary | ICD-10-CM

## 2017-03-31 DIAGNOSIS — Z113 Encounter for screening for infections with a predominantly sexual mode of transmission: Secondary | ICD-10-CM | POA: Insufficient documentation

## 2017-03-31 DIAGNOSIS — Z0000 Encounter for general adult medical examination without abnormal findings: Secondary | ICD-10-CM

## 2017-03-31 DIAGNOSIS — Z8742 Personal history of other diseases of the female genital tract: Secondary | ICD-10-CM | POA: Insufficient documentation

## 2017-03-31 DIAGNOSIS — Z09 Encounter for follow-up examination after completed treatment for conditions other than malignant neoplasm: Secondary | ICD-10-CM | POA: Insufficient documentation

## 2017-03-31 NOTE — Progress Notes (Signed)
47 y.o. G0P0 SingleAfrican AmericanF here for annual exam.  Doing well.  Parents both passed last year.  Had a year of a lot of care giving.  Cycles are still regular.  Flow lasts about 6 days.  Flow is usually heavy for one day.  Pt never went for thyroid ultrasound follow-up with general surgeon.  She cancelled appt.  Had FNA but cellularity was scant.  Was advised to see general surgeon for possible excision.  Did not go.    Desires STD testing.  Patient's last menstrual period was 03/07/2017.          Sexually active: No.  The current method of family planning is abstinence.    Exercising: Yes.    aerobics, weights Smoker:  no  Health Maintenance: Pap:  12/09/15 neg. HR HPV:neg   06/02/15 ASCUS. HR HPV:neg  History of abnormal Pap:  Yes, LEEP 06/2014 MMG:  08/26/15 [place] Right BIRADS4:Suspicious. 09/2015 Right Breast major duct excision  Colonoscopy:  Never BMD:   n/a TDaP:  2009 Screening Labs: STD today   reports that  has never smoked. she has never used smokeless tobacco. She reports that she drinks about 0.6 oz of alcohol per week. She reports that she does not use drugs.  Past Medical History:  Diagnosis Date  . Abnormal Pap smear, low grade squamous intraepithelial lesion (LGSIL) 05/27/2002   colpo bx. Chronic cervicitis  . Abnormal Pap smear, low grade squamous intraepithelial lesion (LGSIL) 11/22/2002   no colpo  . Abnormal Pap smear, low grade squamous intraepithelial lesion (LGSIL) 05/30/2003   no colpo  . Dysmenorrhea   . Fibroids   . Menorrhagia     Past Surgical History:  Procedure Laterality Date  . BREAST DUCTAL SYSTEM EXCISION Right 09/30/2015   Procedure: RIGHT BREAST MAJOR DUCT EXCISION;  Surgeon: Stark Klein, MD;  Location: [facility];  Service: General;  Laterality: Right;  RIGHT BREAST MAJOR DUCT EXCISION  . COLPOSCOPY W/ BIOPSY / CURETTAGE  2011   Chronic cervicitis w/o  dysplasia  . Hysteroscopic myomectomy  2010   Dr. Kerin Perna  .  MYOMECTOMY ABDOMINAL APPROACH  01/01/13   Rondall Allegra infertility    Current Outpatient Medications  Medication Sig Dispense Refill  . Ascorbic Acid (VITAMIN C PO) Take by mouth. Take 2 daily    . cholecalciferol (VITAMIN D) 1000 units tablet Take 1,000 Units by mouth daily.    . fluticasone (FLONASE) 50 MCG/ACT nasal spray Place 2 sprays into both nostrils daily. 16 g 0  . IRON PO Take by mouth.    . Multiple Vitamin (MULTIVITAMIN WITH MINERALS) TABS Take 1 tablet by mouth daily. Reported on 06/02/2015    . naproxen sodium (ANAPROX) 220 MG tablet Take 220 mg by mouth as needed.     No current facility-administered medications for this visit.     Family History  Problem Relation Age of Onset  . Diabetes Mother   . Hypertension Mother   . Breast cancer Mother 50       bilateral, lumpectomy  . Hypertension Father   . Cancer Maternal Grandmother        leukemia  . Diabetes Maternal Grandmother   . Heart failure Maternal Grandfather   . Heart disease Maternal Grandfather   . Breast cancer Cousin 44       07/2013  . Heart disease Cousin   . Breast cancer Maternal Aunt        late 30's  . Diabetes Paternal Grandmother  Review of Systems  HENT: Positive for congestion.   All other systems reviewed and are negative.   Exam:   BP 136/86 (BP Location: Left Arm, Patient Position: Sitting, Cuff Size: Large)   Pulse 86   Resp 16   Ht 5' 5.5" (1.664 m)   Wt 182 lb (82.6 kg)   LMP 03/07/2017   BMI 29.83 kg/m   Height:   Height: 5' 5.5" (166.4 cm)  Ht Readings from Last 3 Encounters:  03/31/17 5' 5.5" (1.664 m)  04/09/16 5\' 6"  (1.676 m)  12/09/15 5\' 5"  (1.651 m)    General appearance: alert, cooperative and appears stated age Head: Normocephalic, without obvious abnormality, atraumatic Neck: no adenopathy, supple, symmetrical, trachea midline and thyroid enlarged but no palpable nodules present Lungs: clear to auscultation bilaterally Breasts: normal appearance, no  masses or tenderness Heart: regular rate and rhythm Abdomen: soft, non-tender; bowel sounds normal; no masses,  no organomegaly Extremities: extremities normal, atraumatic, no cyanosis or edema Skin: Skin color, texture, turgor normal. No rashes or lesions Lymph nodes: Cervical, supraclavicular, and axillary nodes normal. No abnormal inguinal nodes palpated Neurologic: Grossly normal   Pelvic: External genitalia:  no lesions              Urethra:  normal appearing urethra with no masses, tenderness or lesions              Bartholins and Skenes: normal                 Vagina: normal appearing vagina with normal color and discharge, no lesions              Cervix: no lesions              Pap taken: Yes.   Bimanual Exam:  Uterus:  normal size, contour, position, consistency, mobility, non-tender              Adnexa: normal adnexa and no mass, fullness, tenderness               Rectovaginal: Confirms               Anus:  normal sphincter tone, no lesions  Chaperone was present for exam.  A:  Well Woman with normal exam H/O uterine fibroids, s/p myomectomy x 2, 04-28-2008 and 04-28-12 Family hx of tirple negative breast cancer in maternal first cousing, died age 33 in April 29, 2014 Highly suspicious thyroid nodule, FNA with scant cellularity, did not go to general surgeon as advised Desires STD testing H/O LEEP 6/16 with CIn 2 on pathology, first follow up pap was negative with negative HR HPV.  Overdue for pap today.  P:   Mammogram guidelines reviewed.  Aware due.  States will schedule. pap smear and HR HPV obtained today Repeat thyroid ultrasound will be scheduled.  Declines general surgeon consultation today. HIV, RPR, Hep B, GC/CHl, trich obtained today Tdap updated today CMP and lipids obtained today New colonoscopy screening guidelines reviewed.  IFOB given today. Follow up will be planned pending above results.  Will need AEX in year, at least.

## 2017-03-31 NOTE — Progress Notes (Signed)
Scheduled patient while in office for thyroid ultrasound on 04/05/2017 at 2 pm at [facility]. Patient is agreeable to date and time.

## 2017-04-01 LAB — LIPID PANEL
CHOL/HDL RATIO: 3.1 ratio (ref 0.0–4.4)
CHOLESTEROL TOTAL: 160 mg/dL (ref 100–199)
HDL: 52 mg/dL (ref >39)
LDL Calculated: 99 mg/dL (ref 0–99)
TRIGLYCERIDES: 43 mg/dL (ref 0–149)
VLDL Cholesterol Cal: 9 mg/dL (ref 5–40)

## 2017-04-01 LAB — COMPREHENSIVE METABOLIC PANEL
A/G RATIO: 1.6 (ref 1.2–2.2)
ALT: 15 IU/L (ref 0–32)
AST: 19 IU/L (ref 0–40)
Albumin: 4.3 g/dL (ref 3.5–5.5)
Alkaline Phosphatase: 53 IU/L (ref 39–117)
BUN/Creatinine Ratio: 15 (ref 9–23)
BUN: 14 mg/dL (ref 6–24)
Bilirubin Total: 0.3 mg/dL (ref 0.0–1.2)
CALCIUM: 9.2 mg/dL (ref 8.7–10.2)
CO2: 22 mmol/L (ref 20–29)
Chloride: 102 mmol/L (ref 96–106)
Creatinine, Ser: 0.95 mg/dL (ref 0.57–1.00)
GFR, EST AFRICAN AMERICAN: 84 mL/min/{1.73_m2} (ref >59)
GFR, EST NON AFRICAN AMERICAN: 73 mL/min/{1.73_m2} (ref >59)
Globulin, Total: 2.7 g/dL (ref 1.5–4.5)
Glucose: 81 mg/dL (ref 65–99)
POTASSIUM: 3.9 mmol/L (ref 3.5–5.2)
Sodium: 140 mmol/L (ref 134–144)
TOTAL PROTEIN: 7 g/dL (ref 6.0–8.5)

## 2017-04-01 LAB — HEP, RPR, HIV PANEL
HEP B S AG: NEGATIVE
HIV Screen 4th Generation wRfx: NONREACTIVE
RPR: NONREACTIVE

## 2017-04-04 LAB — CYTOLOGY - PAP
Chlamydia: NEGATIVE
DIAGNOSIS: NEGATIVE
HPV (WINDOPATH): NOT DETECTED
NEISSERIA GONORRHEA: NEGATIVE
Trichomonas: NEGATIVE

## 2017-04-05 ENCOUNTER — Ambulatory Visit
Admission: RE | Admit: 2017-04-05 | Discharge: 2017-04-05 | Disposition: A | Discharge status: Home / Self Care | Payer: No Typology Code available for payment source | Source: Ambulatory Visit | Attending: Obstetrics & Gynecology | Admitting: Obstetrics & Gynecology

## 2017-04-05 DIAGNOSIS — E049 Nontoxic goiter, unspecified: Secondary | ICD-10-CM

## 2017-04-06 ENCOUNTER — Telehealth: Payer: Self-pay | Admitting: *Deleted

## 2017-04-06 DIAGNOSIS — E041 Nontoxic single thyroid nodule: Secondary | ICD-10-CM

## 2017-04-06 NOTE — Telephone Encounter (Signed)
Spoke with patient, advised as seen below per Dr. Sabra Heck. Patient request referral to endocrinology to consult prior to biopsy or referral to general surgery. Patient does not have a endocrinology preference. Advised will place referral to [facility], our office referral coordinator will return call with appointment details once reviewed and schduled. Patient verbalizes understanding.   Order placed for referral to endocrinology.   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 Will close encounter.   Cc: Dr. Sabra Heck

## 2017-04-06 NOTE — Telephone Encounter (Signed)
Notes recorded by Burnice Logan, RN on 04/06/2017 at 3:00 PM EDT Left message to call Sharee Pimple at [phone].   Referral to general surgery pended.

## 2017-04-06 NOTE — Telephone Encounter (Signed)
-----   Message from Megan Salon, MD sent at 04/06/2017  8:29 AM EDT ----- Please let pt know that her thyroid ultrasound showed a new 2.5cm nodule that should be biopsied and the same nodule last year that had inadequate sampling.  It needs to be biopsied again as well.  I know she does not want to proceed with this.  I think she and I agreed for her to see a general surgeon and discuss this prior to attempting biopsies.  Needs referral to Dr. Harlow Asa at [facility].  Thanks.  Out of imaging hold.

## 2017-06-01 LAB — FECAL OCCULT BLOOD, IMMUNOCHEMICAL: IFOBT: NEGATIVE

## 2017-06-01 NOTE — Addendum Note (Signed)
Addended by: Lowella Fairy on: 06/01/2017 02:42 PM   Modules accepted: Orders

## 2017-06-01 NOTE — Addendum Note (Signed)
Addended by: Lowella Fairy on: 06/01/2017 02:40 PM   Modules accepted: Orders

## 2017-07-19 ENCOUNTER — Ambulatory Visit: Payer: Self-pay | Admitting: Internal Medicine

## 2017-07-31 ENCOUNTER — Ambulatory Visit: Payer: Self-pay

## 2017-08-03 ENCOUNTER — Telehealth: Payer: Self-pay | Admitting: Obstetrics & Gynecology

## 2017-08-03 ENCOUNTER — Ambulatory Visit: Payer: Self-pay

## 2017-08-03 NOTE — Progress Notes (Deleted)
Patient in today for 1stGardasil injection.   Contraception: Abstinence  LMP: *** Last AEX: 03/31/17 with Dr. Sabra Heck  Injection given in ***. Patient tolerated shot well.   Patient informed next injection due in about *** months.  Advised patient, if not on birth control, to return for next injection with cycle.   Routed to provider for final review.  Encounter closed.

## 2017-08-03 NOTE — Telephone Encounter (Signed)
Patient cancelled nurse visit for gardisil and declined to reschedule at this time. See staff message.

## 2017-09-21 ENCOUNTER — Ambulatory Visit: Payer: Self-pay | Admitting: Internal Medicine

## 2017-10-04 ENCOUNTER — Telehealth: Payer: Self-pay | Admitting: Obstetrics & Gynecology

## 2017-10-04 NOTE — Telephone Encounter (Signed)
Opened telephone encounter in error.

## 2017-10-11 DIAGNOSIS — H18602 Keratoconus, unspecified, left eye: Secondary | ICD-10-CM | POA: Diagnosis not present

## 2017-10-27 ENCOUNTER — Encounter: Payer: Self-pay | Admitting: Internal Medicine

## 2017-10-27 ENCOUNTER — Ambulatory Visit (INDEPENDENT_AMBULATORY_CARE_PROVIDER_SITE_OTHER): Payer: 59 | Admitting: Internal Medicine

## 2017-10-27 VITALS — BP 118/70 | HR 86 | Ht 65.5 in | Wt 161.0 lb

## 2017-10-27 DIAGNOSIS — E042 Nontoxic multinodular goiter: Secondary | ICD-10-CM | POA: Diagnosis not present

## 2017-10-27 LAB — TSH: TSH: 0.61 u[IU]/mL (ref 0.35–4.50)

## 2017-10-27 LAB — T4, FREE: FREE T4: 0.8 ng/dL (ref 0.60–1.60)

## 2017-10-27 LAB — T3, FREE: T3, Free: 4.1 pg/mL (ref 2.3–4.2)

## 2017-10-27 NOTE — Progress Notes (Signed)
Patient ID: Emily Harris, female   DOB: 04-Oct-1970, 47 y.o.   MRN: [id number]    HPI  Emily Harris is a 47 y.o.-year-old female, referred by her PCP, Dr.Sanders, for evaluation and treatment of thyroid nodules.  Patient was found to have thyroid nodules in 2017.  At that time, her thyroid ultrasound (12/09/2015) showed several nodules.  The left dominant nodule was biopsied (12/15/2015).  The sample was insufficient so the biopsy was inconclusive (Bethesda category 1).  Patient refused another biopsy at that time and she was referred to surgery.  A decision was made for the patient to be followed conservatively.  She had a repeat thyroid ultrasound on 04/05/2017 that showed: -a slightly larger right thyroid nodule of 1.9 x 1.2 x 1.0 cm compared to 1.3 x 1.3 x 1.0 cm in 2017.  This nodule is isoechoic.  Recommendation is to follow-up in 1 year. -a stable small thyroid nodule in the left mid lobe, of 1.1 x 0.8 x 0.8 cm, which is the nodule that was previously biopsied -an isoechoic nodule or pseudo-nodule of 2.5 x 1.8 x 1.7 cm which was not seen on the previous ultrasound.  Recommendation is to biopsy. -Other small nodules, 1.1 cm or smaller, not worrisome  Pt denies: - feeling nodules in neck - hoarseness - dysphagia - choking - SOB with lying down  I reviewed pt's thyroid tests: Lab Results  Component Value Date   TSH 1.68 12/09/2015   TSH 1.25 02/23/2015   TSH 2.071 11/10/2014    Pt denies: - fatigue - heat intolerance/cold intolerance - tremors - palpitations - anxiety/depression - hyperdefecation/constipation - dry skin - hair loss  Pt has: - intentional wt loss.  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  + FH of thyroid ds - hypothyroidism in mother, had hemithyroidectomy; M aunt: hypothyroidism. No FH of thyroid cancer. No  h/o radiation tx to head or neck.  No seaweed or kelp. No recent contrast studies. No steroid use. No herbal supplements. No Biotin supplements or Hair, Skin and Nails vitamins.  Pt also has a history of uterine fibroids - s/p myomectomy.  ROS: Constitutional: + See HPI, + poor sleep Eyes: no blurry vision, no xerophthalmia ENT: no sore throat,  + see HPI Cardiovascular: no CP/SOB/palpitations/leg swelling Respiratory: no cough/SOB Gastrointestinal: no N/V/D/C Musculoskeletal: no muscle/joint aches Skin: no rashes, + easy bruising, + excessive hir growth Neurological: no tremors/numbness/tingling/dizziness Psychiatric: no depression/anxiety  Past Medical History:  Diagnosis Date  . Abnormal Pap smear, low grade squamous intraepithelial lesion (LGSIL) 05/27/2002   colpo bx. Chronic cervicitis  . Abnormal Pap smear, low grade squamous intraepithelial lesion (LGSIL) 11/22/2002   no colpo  . Abnormal Pap smear, low grade squamous intraepithelial lesion (LGSIL) 05/30/2003   no colpo  . Dysmenorrhea   . Fibroids   . Menorrhagia    Past Surgical History:  Procedure Laterality Date  . BREAST DUCTAL SYSTEM EXCISION Right 09/30/2015   Procedure: RIGHT BREAST MAJOR DUCT EXCISION;  Surgeon: Stark Klein, MD;  Location: [facility];  Service: General;  Laterality: Right;  RIGHT BREAST MAJOR DUCT EXCISION  . COLPOSCOPY W/ BIOPSY / CURETTAGE  2011   Chronic cervicitis w/o  dysplasia  . Hysteroscopic myomectomy  2010   Dr. Kerin Perna  . MYOMECTOMY ABDOMINAL APPROACH  01/01/13   [facility]ra  infertility   Social History   Socioeconomic History  . Marital status: Single    Spouse name: Not on file  . Number of children: Not on file  . Years of education: 73  . Highest education level: Not on file  Occupational History  . Occupation:  Data entry clerk  Social Needs  . Financial resource strain: Not on file  . Food insecurity:    Worry: Not on file    Inability: Not on  file  . Transportation needs:    Medical: Not on file    Non-medical: Not on file  Tobacco Use  . Smoking status: Never Smoker  . Smokeless tobacco: Never Used  Substance and Sexual Activity  . Alcohol use: Yes    Alcohol/week: 1.0 standard drinks    Types:  Wine, cocktails 1-2 times a week    Comment: socially  . Drug use: No   Current Outpatient Medications on File Prior to Visit  Medication Sig Dispense Refill  . Ascorbic Acid (VITAMIN C PO) Take by mouth. Take 2 daily    . cholecalciferol (VITAMIN D) 1000 units tablet Take 1,000 Units by mouth daily.    . fluticasone (FLONASE) 50 MCG/ACT nasal spray Place 2 sprays into both nostrils daily. (Patient not taking: Reported on 10/27/2017) 16 g 0  . IRON PO Take by mouth.    . Multiple Vitamin (MULTIVITAMIN WITH MINERALS) TABS Take 1 tablet by mouth daily. Reported on 06/02/2015    . naproxen sodium (ANAPROX) 220 MG tablet Take 220 mg by mouth as needed.     No current facility-administered medications on file prior to visit.    No Known Allergies Family History  Problem Relation Age of Onset  . Diabetes Mother   . Hypertension Mother   . Breast cancer Mother 55       bilateral, lumpectomy  . Hypertension Father   . Cancer Maternal Grandmother        leukemia  . Diabetes Maternal Grandmother   . Heart failure Maternal Grandfather   . Heart disease Maternal Grandfather   . Breast cancer Cousin 44       07/2013  . Heart disease Cousin   . Breast cancer Maternal Aunt        late 30's  . Diabetes Paternal Grandmother     PE: BP 118/70   Pulse 86   Ht 5' 5.5" (1.664 m)   Wt 161 lb (73 kg)   LMP 10/11/2017   SpO2 99%   BMI 26.38 kg/m  Wt Readings from Last 3 Encounters:  10/27/17 161 lb (73 kg)  03/31/17 182 lb (82.6 kg)  04/09/16 186 lb (84.4 kg)   Constitutional: overweight, in NAD Eyes: PERRLA, EOMI, no exophthalmos ENT: moist mucous membranes, no thyromegaly, but cervical fullness in the isthmic region, no  cervical lymphadenopathy Cardiovascular: RRR, No MRG Respiratory: CTA B Gastrointestinal: abdomen soft, NT, ND, BS+ Musculoskeletal: no deformities, strength intact in all 4;  Skin: moist, warm, no rashes Neurological: no tremor with outstretched hands, DTR normal in all 4  ASSESSMENT: 1. Thyroid nodules  PLAN: 1. Thyroid nodules - I reviewed the images of her thyroid ultrasound along with the patient. I pointed out that the L 2.5 cm dominant nodule is large, this being a risk factor for cancer.  However, this is not clearly defined as a nodule and could be an area of inflammation (or pseudo-nodule).  Reviewing the images, I do not see concerning features of this nodule.  Patient appears very relieved, as she would prefer not to have another biopsy unless mandatory.  - the rest of the nodules are: - not hypoechoic - without microcalcifications - without internal blood flow - more wide than tall - well delimited from surrounding tissue - I discussed with the patient that for the smaller nodules, we can just follow them for now.  The left 1.1 cm nodule was attempted to be biopsied before but the sample was inconclusive due to scant specimen.  Due to the size of the nodule, I do not feel that we need to repeat a biopsy of this nodule.  The right 1.9 cm nodule is isoechoic and also without concerning features.  We can follow it by ultrasound a year from the previous.   - Pt does not have a thyroid cancer family history or a personal history of RxTx to head/neck. All these would favor benignity.  - At this visit, we we will check her TFTs and I will also add TPO and ATA antibodies to screen her for Hashimoto's thyroiditis.  The chance of these nodules to be inflammatory or higher if she does have Hashimoto's thyroiditis. - she agrees to let me know if she develops neck compression symptoms, in that case, we might need to do either lobectomy or thyroidectomy - I advised pt to join my chart and I  will send her the results through there  - I will see her back next summer.  I advised her to let me know approximately 3 weeks in advance so I can order her thyroid ultrasound and we can review the images and the report when she returns.  Component     Latest Ref Rng & Units 10/27/2017  TSH     0.35 - 4.50 uIU/mL 0.61  T4,Free(Direct)     0.60 - 1.60 ng/dL 0.80  Triiodothyronine,Free,Serum     2.3 - 4.2 pg/mL 4.1  Thyroglobulin Ab     < or = 1 IU/mL <1  Thyroperoxidase Ab SerPl-aCnc     <9 IU/mL 1  No signs of Hashimoto's thyroiditis and her TFTs are normal.  This does not rule out an inflammatory cause of her thyroid heterogeneity.  We will continue with the plan above.  Philemon Kingdom, MD PhD [facility]

## 2017-10-27 NOTE — Patient Instructions (Addendum)
Please call me 3 weeks before our next appt to order the new Thyroid U/S.  Please stop at the lab.  Please come back for a follow-up appointment in June.   Thyroid Nodule A thyroid nodule is an isolatedgrowth of thyroid cells that forms a lump in your thyroid gland. The thyroid gland is a butterfly-shaped gland. It is found in the lower front of your neck. This gland sends chemical messengers (hormones) through your blood to all parts of your body. These hormones are important in regulating your body temperature and helping your body to use energy. Thyroid nodules are common. Most are not cancerous (are benign). You may have one nodule or several nodules. Different types of thyroid nodules include:  Nodules that grow and fill with fluid (thyroid cysts).  Nodules that produce too much thyroid hormone (hot nodules or hyperthyroid).  Nodules that produce no thyroid hormone (cold nodules or hypothyroid).  Nodules that form from cancer cells (thyroid cancers).  What are the causes? Usually, the cause of this condition is not known. What increases the risk? Factors that make this condition more likely to develop include:  Increasing age. Thyroid nodules become more common in people who are older than 46 years of age.  Gender. ? Benign thyroid nodules are more common in women. ? Cancerous (malignant) thyroid nodules are more common in men.  A family history that includes: ? Thyroid nodules. ? Pheochromocytoma. ? Thyroid carcinoma. ? Hyperparathyroidism.  Certain kinds of thyroid diseases, such as Hashimoto thyroiditis.  Lack of iodine.  A history of head and neck radiation, such as from X-rays.  What are the signs or symptoms? It is common for this condition to cause no symptoms. If you have symptoms, they may include:  A lump in your lower neck.  Feeling a lump or tickle in your throat.  Pain in your neck, jaw, or ear.  Having trouble swallowing.  Hot nodules may cause  symptoms that include:  Weight loss.  Warm, flushed skin.  Feeling hot.  Feeling nervous.  A racing heartbeat.  Cold nodules may cause symptoms that include:  Weight gain.  Dry skin.  Brittle hair. This may also occur with hair loss.  Feeling cold.  Fatigue.  Thyroid cancer nodules may cause symptoms that include:  Hard nodules that feel stuck to the thyroid gland.  Hoarseness.  Lumps in the glands near your thyroid (lymph nodes).  How is this diagnosed? A thyroid nodule may be felt by your health care provider during a physical exam. This condition may also be diagnosed based on your symptoms. You may also have tests, including:  An ultrasound. This may be done to confirm the diagnosis.  A biopsy. This involves taking a sample from the nodule and looking at it under a microscope to see if the nodule is benign.  Blood tests to make sure that your thyroid is working properly.  Imaging tests such as MRI or CT scan may be done if: ? Your nodule is large. ? Your nodule is blocking your airway. ? Cancer is suspected.  How is this treated? Treatment depends on the cause and size of your nodule or nodules. If the nodule is benign, treatment may not be necessary. Your health care provider may monitor the nodule to see if it goes away without treatment. If the nodule continues to grow, is cancerous, or does not go away:  It may need to be drained with a needle.  It may need to be removed with  surgery.  If you have surgery, part or all of your thyroid gland may need to be removed as well. Follow these instructions at home:  Pay attention to any changes in your nodule.  Take over-the-counter and prescription medicines only as told by your health care provider.  Keep all follow-up visits as told by your health care provider. This is important. Contact a health care provider if:  Your voice changes.  You have trouble swallowing.  You have pain in your neck, ear,  or jaw that is getting worse.  Your nodule gets bigger.  Your nodule starts to make it harder for you to breathe. Get help right away if:  You have a sudden fever.  You feel very weak.  Your muscles look like they are shrinking (muscle wasting).  You have mood swings.  You feel very restless.  You feel confused.  You are seeing or hearing things that other people do not see or hear (having hallucinations).  You feel suddenly nauseous or throw up.  You suddenly have diarrhea.  You have chest pain.  There is a loss of consciousness. This information is not intended to replace advice given to you by your health care provider. Make sure you discuss any questions you have with your health care provider. Document Released: 11/27/2003 Document Revised: 09/06/2015 Document Reviewed: 04/16/2014 Elsevier Interactive Patient Education  2018 [employer].

## 2017-10-30 LAB — THYROGLOBULIN ANTIBODY: Thyroglobulin Ab: <1 IU/mL (ref <=1)

## 2017-10-30 LAB — THYROID PEROXIDASE ANTIBODY: Thyroperoxidase Ab SerPl-aCnc: 1 IU/mL (ref <9)

## 2017-10-31 ENCOUNTER — Telehealth: Payer: Self-pay

## 2017-10-31 NOTE — Telephone Encounter (Signed)
Left message for patient to return our call at [phone].  

## 2017-10-31 NOTE — Telephone Encounter (Signed)
-----   Message from Philemon Kingdom, MD sent at 10/30/2017  1:40 PM EDT ----- Lenna Sciara, can you please call pt: Her thyroid antibodies and her thyroid function tests are all normal.  No signs of Hashimoto's thyroiditis.  We can continue with the plan to recheck a thyroid ultrasound in a year, as we discussed.

## 2017-11-03 NOTE — Telephone Encounter (Signed)
Letter sent.

## 2018-05-07 ENCOUNTER — Ambulatory Visit: Payer: Self-pay | Admitting: Obstetrics & Gynecology

## 2018-05-17 DIAGNOSIS — H18602 Keratoconus, unspecified, left eye: Secondary | ICD-10-CM | POA: Diagnosis not present

## 2018-07-02 ENCOUNTER — Ambulatory Visit: Payer: 59 | Admitting: Internal Medicine

## 2018-07-12 ENCOUNTER — Other Ambulatory Visit: Payer: Self-pay

## 2018-07-13 ENCOUNTER — Other Ambulatory Visit: Payer: Self-pay

## 2018-07-13 ENCOUNTER — Ambulatory Visit: Payer: 59 | Admitting: Obstetrics & Gynecology

## 2018-07-13 ENCOUNTER — Encounter: Payer: Self-pay | Admitting: Obstetrics & Gynecology

## 2018-07-13 VITALS — BP 132/82 | HR 84 | Temp 97.7°F | Ht 64.75 in | Wt 169.0 lb

## 2018-07-13 DIAGNOSIS — Z01419 Encounter for gynecological examination (general) (routine) without abnormal findings: Secondary | ICD-10-CM | POA: Diagnosis not present

## 2018-07-13 DIAGNOSIS — Z1211 Encounter for screening for malignant neoplasm of colon: Secondary | ICD-10-CM | POA: Diagnosis not present

## 2018-07-13 DIAGNOSIS — Z0000 Encounter for general adult medical examination without abnormal findings: Secondary | ICD-10-CM | POA: Diagnosis not present

## 2018-07-13 NOTE — Progress Notes (Signed)
48 y.o. G0P0 Single Black or [place] American female here for annual exam.  Did see Dr. Cruzita Lederer last year in October.  Conservative follow up recommended for thyroid nodules.  Has appt in July.  Follow up ultrasound has been recommended.    Cycles have been irregular the past three months.  She skipped her cycle in March.  She did have a regular flow in April and May but has experienced two cycles in June.  Feels like this is stress related.    She was furloughed in all of April and May.  She got unemployment during this time.    Doing better this year emotionally.  Parents passed in 2018 and 2019.  Not SA in the last year.  Patient's last menstrual period was 07/10/2018 (exact date).          Sexually active: No.  The current method of family planning is abstinence.    Exercising: Yes.    walking 3 x weekly  Smoker:  no  Health Maintenance: Pap:  03/31/17 Neg. HR HPV:neg   12/09/15 neg. HR HPV:neg  History of abnormal Pap:  Yes, LEEP 2016 MMG:  08/26/15 [place] Right BIRADS4:Suspicious. 09/2015 Right Breast major duct excision with benign papilloma pathology Colonoscopy:  Never TDaP:  2019 Screening Labs: Here today    reports that she has never smoked. She has never used smokeless tobacco. She reports current alcohol use. She reports that she does not use drugs.  Past Medical History:  Diagnosis Date  . Abnormal Pap smear, low grade squamous intraepithelial lesion (LGSIL) 05/27/2002   colpo bx. Chronic cervicitis  . Abnormal Pap smear, low grade squamous intraepithelial lesion (LGSIL) 11/22/2002   no colpo  . Abnormal Pap smear, low grade squamous intraepithelial lesion (LGSIL) 05/30/2003   no colpo  . Dysmenorrhea   . Fibroids   . Menorrhagia     Past Surgical History:  Procedure Laterality Date  . BREAST DUCTAL SYSTEM EXCISION Right 09/30/2015   Procedure: RIGHT BREAST MAJOR DUCT EXCISION;  Surgeon: Stark Klein, MD;  Location: [facility];  Service: General;   Laterality: Right;  RIGHT BREAST MAJOR DUCT EXCISION  . COLPOSCOPY W/ BIOPSY / CURETTAGE  2011   Chronic cervicitis w/o  dysplasia  . Hysteroscopic myomectomy  2010   Dr. Kerin Perna  . MYOMECTOMY ABDOMINAL APPROACH  01/01/13   [facility]ra infertility    Current Outpatient Medications  Medication Sig Dispense Refill  . Ascorbic Acid (VITAMIN C PO) Take by mouth. Take 2 daily    . cholecalciferol (VITAMIN D) 1000 units tablet Take 1,000 Units by mouth daily.    . Multiple Vitamin (MULTIVITAMIN WITH MINERALS) TABS Take 1 tablet by mouth daily. Reported on 06/02/2015    . Multiple Vitamins-Minerals (ONE-A-DAY VITACRAVES IMMUNITY PO) Take by mouth daily.     No current facility-administered medications for this visit.     Family History  Problem Relation Age of Onset  . Diabetes Mother   . Hypertension Mother   . Breast cancer Mother 22       bilateral, lumpectomy  . Hypertension Father   . Cancer Maternal Grandmother        leukemia  . Diabetes Maternal Grandmother   . Heart failure Maternal Grandfather   . Heart disease Maternal Grandfather   . Breast cancer Cousin 44       07/2013  . Heart disease Cousin   . Breast cancer Maternal Aunt        late 30's  .  Diabetes Paternal Grandmother     Review of Systems  All other systems reviewed and are negative.   Exam:   BP 132/82   Pulse 84   Temp 97.7 F (36.5 C) (Temporal)   Ht 5' 4.75" (1.645 m)   Wt 169 lb (76.7 kg)   LMP 07/10/2018 (Exact Date)   BMI 28.34 kg/m    Height: 5' 4.75" (164.5 cm)  Ht Readings from Last 3 Encounters:  07/13/18 5' 4.75" (1.645 m)  10/27/17 5' 5.5" (1.664 m)  03/31/17 5' 5.5" (1.664 m)    General appearance: alert, cooperative and appears stated age Head: Normocephalic, without obvious abnormality, atraumatic Neck: no adenopathy, supple, symmetrical, trachea midline and thyroid with right 2-3cm nodule noted, non tender Lungs: clear to auscultation bilaterally Breasts: normal  appearance, no masses or tenderness Heart: regular rate and rhythm Abdomen: soft, non-tender; bowel sounds normal; no masses,  no organomegaly Extremities: extremities normal, atraumatic, no cyanosis or edema Skin: Skin color, texture, turgor normal. No rashes or lesions Lymph nodes: Cervical, supraclavicular, and axillary nodes normal. No abnormal inguinal nodes palpated Neurologic: Grossly normal   Pelvic: External genitalia:  no lesions              Urethra:  normal appearing urethra with no masses, tenderness or lesions              Bartholins and Skenes: normal                 Vagina: normal appearing vagina with normal color and discharge, no lesions              Cervix: no lesions              Pap taken: No. Bimanual Exam:  Uterus: mildly enlarged, about 8 weeks, non tender, mobile              Adnexa: normal adnexa and no mass, fullness, tenderness               Rectovaginal: Confirms               Anus:  normal sphincter tone, no lesions  Chaperone was present for exam.  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  Pt aware this is due.  Desires to call herself pap smear with neg HR HPV 2019, not indicated today CBC, CMP, lipids, TSH, Vit D obtained today Ifob given again today Has follow up with Dr. Cruzita Lederer in July.  Will have follow up ultrasound done as well Return annually or prn

## 2018-07-14 LAB — LIPID PANEL

## 2018-07-16 LAB — COMPREHENSIVE METABOLIC PANEL
ALT: 14 IU/L (ref 0–32)
AST: 21 IU/L (ref 0–40)
Albumin/Globulin Ratio: 1.7 (ref 1.2–2.2)
Albumin: 4.2 g/dL (ref 3.8–4.8)
Alkaline Phosphatase: 48 IU/L (ref 39–117)
BUN/Creatinine Ratio: 18 (ref 9–23)
BUN: 15 mg/dL (ref 6–24)
Bilirubin Total: 0.3 mg/dL (ref 0.0–1.2)
CO2: 22 mmol/L (ref 20–29)
Calcium: 9.3 mg/dL (ref 8.7–10.2)
Chloride: 103 mmol/L (ref 96–106)
Creatinine, Ser: 0.85 mg/dL (ref 0.57–1.00)
GFR calc Af Amer: 95 mL/min/{1.73_m2} (ref >59)
GFR calc non Af Amer: 82 mL/min/{1.73_m2} (ref >59)
Globulin, Total: 2.5 g/dL (ref 1.5–4.5)
Glucose: 85 mg/dL (ref 65–99)
Potassium: 4.3 mmol/L (ref 3.5–5.2)
Sodium: 141 mmol/L (ref 134–144)
Total Protein: 6.7 g/dL (ref 6.0–8.5)

## 2018-07-16 LAB — LIPID PANEL
Chol/HDL Ratio: 2.8 ratio (ref 0.0–4.4)
Cholesterol, Total: 167 mg/dL (ref 100–199)
HDL: 59 mg/dL (ref >39)
LDL Calculated: 98 mg/dL (ref 0–99)
Triglycerides: 50 mg/dL (ref 0–149)
VLDL Cholesterol Cal: 10 mg/dL (ref 5–40)

## 2018-07-16 LAB — CBC
Hematocrit: 38.6 % (ref 34.0–46.6)
Hemoglobin: 12.7 g/dL (ref 11.1–15.9)
MCH: 31.9 pg (ref 26.6–33.0)
MCHC: 32.9 g/dL (ref 31.5–35.7)
MCV: 97 fL (ref 79–97)
Platelets: 302 10*3/uL (ref 150–450)
RBC: 3.98 x10E6/uL (ref 3.77–5.28)
RDW: 13.5 % (ref 11.7–15.4)
WBC: 5.5 10*3/uL (ref 3.4–10.8)

## 2018-07-16 LAB — VITAMIN D 25 HYDROXY (VIT D DEFICIENCY, FRACTURES): Vit D, 25-Hydroxy: 62.9 ng/mL (ref 30.0–100.0)

## 2018-07-16 LAB — TSH: TSH: 0.822 u[IU]/mL (ref 0.450–4.500)

## 2018-08-06 ENCOUNTER — Encounter: Payer: Self-pay | Admitting: Internal Medicine

## 2018-08-06 ENCOUNTER — Other Ambulatory Visit: Payer: Self-pay

## 2018-08-06 ENCOUNTER — Ambulatory Visit: Payer: 59 | Admitting: Internal Medicine

## 2018-08-06 VITALS — BP 138/80 | HR 78 | Temp 98.3°F | Ht 65.0 in | Wt 169.0 lb

## 2018-08-06 DIAGNOSIS — E042 Nontoxic multinodular goiter: Secondary | ICD-10-CM | POA: Diagnosis not present

## 2018-08-06 NOTE — Progress Notes (Signed)
Patient ID: Emily Harris, female   DOB: 10/27/1970, 48 y.o.   MRN: [id number]    HPI  Emily Harris is a 48 y.o.-year-old female, initially referred by her PCP, Dr.Sanders, returning for follow-up for thyroid nodules.  Last visit 9 months ago.  Patient was found to have thyroid nodules in 2017.Marland Kitchen   At that time, however thyroid ultrasound (12/09/2015) showed several nodules.  The left dominant nodule was biopsied (12/15/2015).  The sample was insufficient so the biopsy was inconclusive (Bethesda category 1).  Patient refused another biopsy at that time and she was referred to surgery.  A decision was made for the patient to be followed conservatively.  She had a repeat thyroid ultrasound on 04/05/2017 that showed: -a slightly larger right thyroid nodule of 1.9 x 1.2 x 1.0 cm compared to 1.3 x 1.3 x 1.0 cm in 2017.  This nodule is isoechoic.  Recommendation is to follow-up in 1 year. -a stable small thyroid nodule in the left mid lobe, of 1.1 x 0.8 x 0.8 cm, which is the nodule that was previously biopsied -an isoechoic nodule or pseudo-nodule of 2.5 x 1.8 x 1.7 cm which was not seen on the previous ultrasound.  Recommendation is to biopsy. -Other small nodules, 1.1 cm or smaller, not worrisome  At last visit, she has a large, 2.5 cm thyroid nodule was not clearly defined and it appeared as a pseudo-nodule, we decided to repeat the ultrasound in a year and then decide whether a biopsy would be necessary.  She agreed with this plan.  Pt denies: - feeling nodules in neck - hoarseness - dysphagia - choking - SOB with lying down  Reviewed patient's TFTs: Lab Results  Component Value Date   TSH 0.822 07/13/2018   TSH 0.61 10/27/2017   TSH 1.68 12/09/2015   TSH 1.25 02/23/2015   TSH 2.071 11/10/2014   FREET4 0.80 10/27/2017    Her thyroid antibodies were not elevated: Component     Latest Ref Rng & Units 10/27/2017  Thyroglobulin Ab     < or = 1 IU/mL <1  Thyroperoxidase Ab  SerPl-aCnc     <9 IU/mL 1   Patient denies: - Weight gain - Fatigue - Cold intolerance - Constipation - Hair loss  At last visit, she had an intentional weight loss of approximately 20 pounds: 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  At this visit, she gained approximately 8 pounds, and she attributes this to the coronavirus pandemic.  She was initially furloughed, but now returned to work.  + FH of thyroid ds - hypothyroidism in mother, had hemithyroidectomy; M aunt: hypothyroidism. No FH of thyroid cancer. No h/o radiation tx to head or neck.  No seaweed or kelp. No recent contrast studies. No herbal supplements. No Biotin use. No recent steroids use.   Pt also has a history of uterine fibroids - s/p myomectomy.  ROS: Constitutional: + weight gain/no weight loss, no fatigue, no subjective hyperthermia, no subjective hypothermia Eyes: no blurry vision, no xerophthalmia ENT: no sore throat, + see HPI Cardiovascular: no CP/no SOB/no palpitations/no leg swelling Respiratory: no cough/no SOB/no wheezing Gastrointestinal: no N/no V/no D/no C/no acid reflux Musculoskeletal: no muscle aches/no joint aches Skin: no rashes, no hair loss Neurological: no tremors/no numbness/no tingling/no dizziness  I reviewed pt's medications, allergies, PMH, social hx,  family hx, and changes were documented in the history of present illness. Otherwise, unchanged from my initial visit note.  Past Medical History:  Diagnosis Date  . Abnormal Pap smear, low grade squamous intraepithelial lesion (LGSIL) 05/27/2002   colpo bx. Chronic cervicitis  . Abnormal Pap smear, low grade squamous intraepithelial lesion (LGSIL) 11/22/2002   no colpo  . Abnormal Pap smear, low grade squamous intraepithelial lesion (LGSIL) 05/30/2003   no colpo  . Dysmenorrhea   . Fibroids    . Menorrhagia    Past Surgical History:  Procedure Laterality Date  . BREAST DUCTAL SYSTEM EXCISION Right 09/30/2015   Procedure: RIGHT BREAST MAJOR DUCT EXCISION;  Surgeon: Stark Klein, MD;  Location: [facility];  Service: General;  Laterality: Right;  RIGHT BREAST MAJOR DUCT EXCISION  . COLPOSCOPY W/ BIOPSY / CURETTAGE  2011   Chronic cervicitis w/o  dysplasia  . Hysteroscopic myomectomy  2010   Dr. Kerin Perna  . MYOMECTOMY ABDOMINAL APPROACH  01/01/13   Rondall Allegra infertility   Social History   Socioeconomic History  . Marital status: Single    Spouse name: Not on file  . Number of children: Not on file  . Years of education: 72  . Highest education level: Not on file  Occupational History  . Occupation:  Data entry clerk  Social Needs  . Financial resource strain: Not on file  . Food insecurity:    Worry: Not on file    Inability: Not on file  . Transportation needs:    Medical: Not on file    Non-medical: Not on file  Tobacco Use  . Smoking status: Never Smoker  . Smokeless tobacco: Never Used  Substance and Sexual Activity  . Alcohol use: Yes    Alcohol/week: 1.0 standard drinks    Types:  Wine, cocktails 1-2 times a week    Comment: socially  . Drug use: No   Current Outpatient Medications on File Prior to Visit  Medication Sig Dispense Refill  . Ascorbic Acid (VITAMIN C PO) Take by mouth. Take 2 daily    . cholecalciferol (VITAMIN D) 1000 units tablet Take 1,000 Units by mouth daily.    . Multiple Vitamin (MULTIVITAMIN WITH MINERALS) TABS Take 1 tablet by mouth daily. Reported on 06/02/2015    . Multiple Vitamins-Minerals (ONE-A-DAY VITACRAVES IMMUNITY PO) Take by mouth daily.     No current facility-administered medications on file prior to visit.    No Known Allergies Family History  Problem Relation Age of Onset  . Diabetes Mother   . Hypertension Mother   . Breast cancer Mother 97       bilateral, lumpectomy  . Hypertension  Father   . Cancer Maternal Grandmother        leukemia  . Diabetes Maternal Grandmother   . Heart failure Maternal Grandfather   . Heart disease Maternal Grandfather   . Breast cancer Cousin 44       07/2013  . Heart disease Cousin   . Breast cancer Maternal Aunt        late 30's  . Diabetes Paternal Grandmother     PE: BP 138/80 (BP Location: Left Arm, Patient Position: Sitting, Cuff Size: Normal)   Pulse 78   Temp 98.3 F (36.8 C)   Ht 5\' 5"  (1.651 m)   Wt 169 lb (76.7 kg)   LMP 07/10/2018 (Exact Date)   SpO2 98%   BMI 28.12 kg/m  Wt Readings from Last 3 Encounters:  08/06/18  169 lb (76.7 kg)  07/13/18 169 lb (76.7 kg)  10/27/17 161 lb (73 kg)   Constitutional: Slightly overweight, in NAD Eyes: PERRLA, EOMI, no exophthalmos ENT: moist mucous membranes, no thyromegaly but cervical fullness in the isthmic region, no cervical lymphadenopathy Cardiovascular: RRR, No MRG Respiratory: CTA B Gastrointestinal: abdomen soft, NT, ND, BS+ Musculoskeletal: no deformities, strength intact in all 4 Skin: moist, warm, no rashes Neurological: no tremor with outstretched hands, DTR normal in all 4  ASSESSMENT: 1. Thyroid nodules  PLAN: 1. Thyroid nodules -I reviewed the images of her thyroid ultrasound and also reviewed the report along with the patient.  The left 2.5 cm dominant nodule is large, however, this was not clearly defined as a nodule and could have been just an area of inflammation (pseudo-nodule).  At last visit, we reviewed the recommendations and we discussed that she did not absolutely need another biopsy at that time and decided to wait until next year to recheck it.  Of note, the nodules did not appear to be hypoechoic, they did not have microcalcifications, internal blood flow, there disposition was more wide than tall and they were well delimited from surrounding tissue, which are all good prognostic factors.  The 1.1 left centimeter nodule was attempted to be  biopsied before but the sample was inconclusive due to scant specimen.  Due to the size of the nodule, I did not feel that we need to repeat a biopsy.  She agreed wholeheartedly.  The right 1.9 cm nodule was isoechoic and also without concerning features and will take another look at it after this years ultrasound. -She also does not have a family history of thyroid nodule or a history of radiation therapy to the head or neck, which are also pointing towards benign nodules. -At this visit, we reviewed her most recent TFTs and these were normal in 06/2018.  We do not need to repeat these.  We checked her for Hashimoto's thyroiditis at last visit but the TPO and ATA antibodies were not elevated. -She has no neck compression symptoms at this visit -I will ordered thyroid ultrasound now and will have her return in 2 years if the nodules appear to be unchanged.  She agrees with this plan.  - time spent with the patient: 15 min,  of which >50% was spent in obtaining information about her symptoms, reviewing her previous labs, evaluations, and treatments, counseling her about her condition (please see the discussed topics above), and developing a plan to further investigate and treat it; she had a number of questions which I addressed.  Orders Placed This Encounter  Procedures  . US THYROID   Philemon Kingdom, MD PhD [facility] Endocrinology

## 2018-08-06 NOTE — Patient Instructions (Signed)
We will schedule a new thyroid ultrasound for you.  Please return in 2 years.

## 2018-08-22 ENCOUNTER — Ambulatory Visit
Admission: RE | Admit: 2018-08-22 | Discharge: 2018-08-22 | Disposition: A | Discharge status: Home / Self Care | Payer: BLUE CROSS/BLUE SHIELD | Source: Ambulatory Visit | Attending: Internal Medicine | Admitting: Internal Medicine

## 2018-08-22 DIAGNOSIS — E042 Nontoxic multinodular goiter: Secondary | ICD-10-CM

## 2018-10-08 LAB — FECAL OCCULT BLOOD, IMMUNOCHEMICAL

## 2018-10-15 ENCOUNTER — Telehealth: Payer: Self-pay | Admitting: *Deleted

## 2018-10-15 NOTE — Telephone Encounter (Signed)
-----   Message from Megan Salon, MD sent at 10/12/2018  5:50 PM EDT ----- Please call pt and give instructions for timing of collection and when needs to be in lab.  Please let her know we can put one in the front for her to pick up if willing to do this again.  Thanks.

## 2018-10-15 NOTE — Telephone Encounter (Signed)
LM for pt to call back.

## 2018-10-16 NOTE — Telephone Encounter (Signed)
Second attempt to contact patient. LMTCB

## 2018-10-22 NOTE — Telephone Encounter (Signed)
Patient returning call to [place]. °

## 2018-10-23 NOTE — Telephone Encounter (Signed)
Left voice mail to call back 

## 2018-10-31 NOTE — Telephone Encounter (Signed)
Left voicemail to call back.  Should we send letter? Routed to Dr. Sabra Heck

## 2018-11-07 ENCOUNTER — Encounter: Payer: Self-pay | Admitting: Obstetrics & Gynecology

## 2018-11-07 NOTE — Telephone Encounter (Signed)
MyChart letter sent to pt.  Ok to print as well and send if she does not respond to [place].  Thanks.

## 2018-11-08 NOTE — Telephone Encounter (Signed)
Letter printed. Will mail after signature.

## 2018-11-08 NOTE — Progress Notes (Signed)
Letter mailed to patient today.

## 2018-12-03 ENCOUNTER — Telehealth: Payer: BLUE CROSS/BLUE SHIELD | Admitting: Family

## 2018-12-03 DIAGNOSIS — Z20828 Contact with and (suspected) exposure to other viral communicable diseases: Secondary | ICD-10-CM

## 2018-12-03 DIAGNOSIS — Z20822 Contact with and (suspected) exposure to covid-19: Secondary | ICD-10-CM

## 2018-12-03 MED ORDER — BENZONATATE 100 MG PO CAPS: 100.0000 mg | ORAL_CAPSULE | Freq: Three times a day (TID) | ORAL | 0 refills | Status: AC | PRN

## 2018-12-03 NOTE — Progress Notes (Signed)
E-Visit for Corona Virus Screening   Your current symptoms could be consistent with the coronavirus.  Many health care providers can now test patients at their office but not all are.  [facility] has multiple testing sites. For information on our COVID testing locations and hours go to [email or web address]  Please quarantine yourself while awaiting your test results.  We are enrolling you in our [place] for [place] . Daily you will receive a questionnaire within the [facility] website. Our COVID 19 response team willl be monitoriing your responses daily. Please continue good preventive care measures, including:  frequent hand-washing, avoid touching your face, cover coughs/sneezes, stay out of crowds and keep a 6 foot distance from others.    You can go to one of the testing sites listed below, while they are opened (see hours). You do not need an order and will stay in your car during the test. You do need to self isolate until your results return and if positive 10 days from when your symptoms started and until you are 3 days fever free.   Testing Locations (Monday - Friday, 8 a.m. - 3:30 p.m.) . [place]: [facility] at [facility], [address], [place], [place]: [place], [place], [place], [place] (entrance off [address])  . [facility]: (Closed each Monday): Testing site relocated to the short stay covered drive at [facility]. (Use the [facility] entrance to [facility] next to [place] is a respiratory illness with symptoms that are similar to the flu. Symptoms are typically mild to moderate, but there have been cases of severe illness and death due to the virus. The following symptoms may appear 2-14 days after exposure: . Fever . Cough . Shortness of breath or difficulty breathing . Chills . Repeated shaking with chills . Muscle  pain . Headache . Sore throat . New loss of taste or smell . Fatigue . Congestion or runny nose . Nausea or vomiting . Diarrhea  If you develop fever/cough/breathlessness, please stay home for 10 days with improving symptoms and until you have had 24 hours of no fever (without taking a fever reducer).  Go to the nearest hospital ED for assessment if fever/cough/breathlessness are severe or illness seems like a threat to life.  It is vitally important that if you feel that you have an infection such as this virus or any other virus that you stay home and away from places where you may spread it to others.  You should avoid contact with people age 47 and older.   You should wear a mask or cloth face covering over your nose and mouth if you must be around other people or animals, including pets (even at home). Try to stay at least 6 feet away from other people. This will protect the people around you.  You can use medication such as A prescription cough medication called Tessalon Perles 100 mg. You may take 1-2 capsules every 8 hours as needed for cough.  You may also take acetaminophen (Tylenol) as needed for fever.   Reduce your risk of any infection by using the same precautions used for avoiding the common cold or flu:  Marland Kitchen Wash your hands often with soap and warm water for at least 20 seconds.  If soap and water are not readily available, use an alcohol-based hand sanitizer with at least 60% alcohol.  . If coughing or sneezing,  cover your mouth and nose by coughing or sneezing into the elbow areas of your shirt or coat, into a tissue or into your sleeve (not your hands). . Avoid shaking hands with others and consider head nods or verbal greetings only. . Avoid touching your eyes, nose, or mouth with unwashed hands.  . Avoid close contact with people who are sick. . Avoid places or events with large numbers of people in one location, like concerts or sporting events. . Carefully consider  travel plans you have or are making. . If you are planning any travel outside or inside the [place], visit the CDC's Travelers' Health webpage for the latest health notices. . If you have some symptoms but not all symptoms, continue to monitor at home and seek medical attention if your symptoms worsen. . If you are having a medical emergency, call 911.  HOME CARE . Only take medications as instructed by your medical team. . Drink plenty of fluids and get plenty of rest. . A steam or ultrasonic humidifier can help if you have congestion.   GET HELP RIGHT AWAY IF YOU HAVE EMERGENCY WARNING SIGNS** FOR COVID-19. If you or someone is showing any of these signs seek emergency medical care immediately. Call 911 or proceed to your closest emergency facility if: . You develop worsening high fever. . Trouble breathing . Bluish lips or face . Persistent pain or pressure in the chest . New confusion . Inability to wake or stay awake . You cough up blood. . Your symptoms become more severe  **This list is not all possible symptoms. Contact your medical provider for any symptoms that are sever or concerning to you.   MAKE SURE YOU   Understand these instructions.  Will watch your condition.  Will get help right away if you are not doing well or get worse.  Your e-visit answers were reviewed by a board certified advanced clinical practitioner to complete your personal care plan.  Depending on the condition, your plan could have included both over the counter or prescription medications.  If there is a problem please reply once you have received a response from your provider.  Your safety is important to [place].  If you have drug allergies check your prescription carefully.    You can use MyChart to ask questions about today's visit, request a non-urgent call back, or ask for a work or school excuse for 24 hours related to this e-Visit. If it has been greater than 24 hours you will need to follow up with  your provider, or enter a new e-Visit to address those concerns. You will get an e-mail in the next two days asking about your experience.  I hope that your e-visit has been valuable and will speed your recovery. Thank you for using e-visits.   Approximately 5 minutes was spent documenting and reviewing patient's chart.

## 2018-12-04 ENCOUNTER — Encounter (INDEPENDENT_AMBULATORY_CARE_PROVIDER_SITE_OTHER): Payer: Self-pay

## 2018-12-04 ENCOUNTER — Other Ambulatory Visit: Payer: Self-pay

## 2018-12-04 DIAGNOSIS — Z20822 Contact with and (suspected) exposure to covid-19: Secondary | ICD-10-CM

## 2018-12-05 ENCOUNTER — Encounter (INDEPENDENT_AMBULATORY_CARE_PROVIDER_SITE_OTHER): Payer: Self-pay

## 2018-12-06 ENCOUNTER — Encounter (INDEPENDENT_AMBULATORY_CARE_PROVIDER_SITE_OTHER): Payer: Self-pay

## 2018-12-06 LAB — NOVEL CORONAVIRUS, NAA: SARS-CoV-2, NAA: NOT DETECTED

## 2018-12-07 ENCOUNTER — Encounter (INDEPENDENT_AMBULATORY_CARE_PROVIDER_SITE_OTHER): Payer: Self-pay

## 2018-12-08 ENCOUNTER — Encounter (INDEPENDENT_AMBULATORY_CARE_PROVIDER_SITE_OTHER): Payer: Self-pay

## 2018-12-09 ENCOUNTER — Encounter (INDEPENDENT_AMBULATORY_CARE_PROVIDER_SITE_OTHER): Payer: Self-pay

## 2018-12-10 ENCOUNTER — Encounter (INDEPENDENT_AMBULATORY_CARE_PROVIDER_SITE_OTHER): Payer: Self-pay

## 2018-12-11 ENCOUNTER — Encounter (INDEPENDENT_AMBULATORY_CARE_PROVIDER_SITE_OTHER): Payer: Self-pay

## 2018-12-12 ENCOUNTER — Encounter (INDEPENDENT_AMBULATORY_CARE_PROVIDER_SITE_OTHER): Payer: Self-pay

## 2018-12-13 ENCOUNTER — Encounter (INDEPENDENT_AMBULATORY_CARE_PROVIDER_SITE_OTHER): Payer: Self-pay

## 2018-12-14 ENCOUNTER — Encounter (INDEPENDENT_AMBULATORY_CARE_PROVIDER_SITE_OTHER): Payer: Self-pay

## 2018-12-15 ENCOUNTER — Encounter (INDEPENDENT_AMBULATORY_CARE_PROVIDER_SITE_OTHER): Payer: Self-pay

## 2018-12-16 ENCOUNTER — Encounter (INDEPENDENT_AMBULATORY_CARE_PROVIDER_SITE_OTHER): Payer: Self-pay

## 2019-09-16 NOTE — Progress Notes (Signed)
49 y.o. G0P0 Single Black or [place] American female here for annual exam.  Is working this year.  Doing second shift.  Not sure this was a good move for her.    Saw Dr. Cruzita Lederer last July and had ultrasound in August.  Follow up two years (next year) recommend.  Ultrasound recommend at least nodule have some dedicated follow up.    Due to stressors this past year, she feels her cycles have changed.  When stressors have been higher for her, she has skipped cycle.  She skipped March and in August.  Not SA.    Doing data analysis classes as well.  Would like to change jobs.  Had varicella titer in 2016.  Pt is non-immune.  We discussed vaccination then and is interested in doing this now.  Varivax discussed.  LMP was in July.         Sexually active: No.  The current method of family planning is none.    Exercising: No.   Smoker:  no  Health Maintenance: Pap:  03-31-17 neg HPV HR neg History of abnormal Pap:  LEEP 2016 MMG:  08-26-15 U/S right breast birads 4: suspicious, 9/17 rt breast major duct excision with benign papilloma pathology.  Pt is planning to do this this year.   Colonoscopy:  none BMD:   none TDaP:  2019  Pneumonia vaccine(s):  no Shingrix:   no Hep C testing: Unknown if ever had  Screening Labs: 2020   reports that she has never smoked. She has never used smokeless tobacco. She reports current alcohol use. She reports that she does not use drugs.  Past Medical History:  Diagnosis Date  . Abnormal Pap smear, low grade squamous intraepithelial lesion (LGSIL) 05/27/2002   colpo bx. Chronic cervicitis  . Abnormal Pap smear, low grade squamous intraepithelial lesion (LGSIL) 11/22/2002   no colpo  . Abnormal Pap smear, low grade squamous intraepithelial lesion (LGSIL) 05/30/2003   no colpo  . Dysmenorrhea   . Fibroids   . Menorrhagia     Past Surgical History:  Procedure Laterality Date  . BREAST DUCTAL SYSTEM EXCISION Right 09/30/2015   Procedure: RIGHT BREAST MAJOR  DUCT EXCISION;  Surgeon: Stark Klein, MD;  Location: [facility];  Service: General;  Laterality: Right;  RIGHT BREAST MAJOR DUCT EXCISION  . COLPOSCOPY W/ BIOPSY / CURETTAGE  2011   Chronic cervicitis w/o  dysplasia  . Hysteroscopic myomectomy  2010   Dr. Kerin Perna  . MYOMECTOMY ABDOMINAL APPROACH  01/01/13   Rondall Allegra infertility    Current Outpatient Medications  Medication Sig Dispense Refill  . Ascorbic Acid (VITAMIN C PO) Take by mouth. Take 2 daily    . benzonatate (TESSALON PERLES) 100 MG capsule Take 1 capsule (100 mg total) by mouth 3 (three) times daily as needed. 20 capsule 0  . cholecalciferol (VITAMIN D) 1000 units tablet Take 1,000 Units by mouth daily.    . Multiple Vitamin (MULTIVITAMIN WITH MINERALS) TABS Take 1 tablet by mouth daily. Reported on 06/02/2015    . Multiple Vitamins-Minerals (ONE-A-DAY VITACRAVES IMMUNITY PO) Take by mouth daily.     No current facility-administered medications for this visit.    Family History  Problem Relation Age of Onset  . Diabetes Mother   . Hypertension Mother   . Breast cancer Mother 34       bilateral, lumpectomy  . Hypertension Father   . Cancer Maternal Grandmother        leukemia  .  Diabetes Maternal Grandmother   . Heart failure Maternal Grandfather   . Heart disease Maternal Grandfather   . Breast cancer Cousin 44       07/2013  . Heart disease Cousin   . Breast cancer Maternal Aunt        late 30's  . Diabetes Paternal Grandmother     Review of Systems  All other systems reviewed and are negative.   Exam:   BP (!) 152/78 (BP Location: Left Arm, Patient Position: Sitting, Cuff Size: Normal)   Pulse 98   Resp 12   Ht 5\' 5"  (1.651 m)   Wt 168 lb (76.2 kg)   LMP 07/19/2019 (Exact Date)   SpO2 98%   BMI 27.96 kg/m   Height: 5\' 5"  (165.1 cm)  General appearance: alert, cooperative and appears stated age Head: Normocephalic, without obvious abnormality, atraumatic Neck: no adenopathy,  supple, symmetrical, trachea midline and thyroid enlarged but stable in size Lungs: clear to auscultation bilaterally Breasts: normal appearance, no masses or tenderness Heart: regular rate and rhythm Abdomen: soft, non-tender; bowel sounds normal; no masses,  no organomegaly Extremities: extremities normal, atraumatic, no cyanosis or edema Skin: Skin color, texture, turgor normal. No rashes or lesions Lymph nodes: Cervical, supraclavicular, and axillary nodes normal. No abnormal inguinal nodes palpated Neurologic: Grossly normal   Pelvic: External genitalia:  no lesions              Urethra:  normal appearing urethra with no masses, tenderness or lesions              Bartholins and Skenes: normal                 Vagina: normal appearing vagina with normal color and discharge, no lesions              Cervix: no lesions              Pap taken: Yes.   Bimanual Exam:  Uterus:  normal size, contour, position, consistency, mobility, non-tender              Adnexa: normal adnexa and no mass, fullness, tenderness               Rectovaginal: Confirms               Anus:  normal sphincter tone, no lesions  Chaperone, [employer], CMA, was present for exam.  A:  Well Woman with normal exam H/o uterine fibroids, s/p myomectomy x 2, 2010 and 2014 Family hx of breast cancer in mother and cousin Omar Person model for breast cancer is 26.4% Not SA in last year but wants vaginal STD testing Thyroid nodules, followed by Dr. 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  Breast MRI discussed.  Declines at this time.  She knows MMG is overdue and will schedule. pap smear with HR HPV obtained today Screening lab work all negative last year Antimullerian hormone obtained today  Rx for varivax x 1, repeat 4-8 weeks tiven Colon cancer screening discussed.  Cologuard info  given.  She is to let me know if wants GI referral or cologuard ordered.   Will follow up with Dr. Cruzita Lederer next year GC/Chl/trich obtained today Return annually or prn

## 2019-09-19 ENCOUNTER — Encounter: Payer: Self-pay | Admitting: Obstetrics & Gynecology

## 2019-09-19 ENCOUNTER — Other Ambulatory Visit: Payer: Self-pay

## 2019-09-19 ENCOUNTER — Ambulatory Visit: Payer: 59 | Admitting: Obstetrics & Gynecology

## 2019-09-19 ENCOUNTER — Other Ambulatory Visit (HOSPITAL_COMMUNITY)
Admission: RE | Admit: 2019-09-19 | Discharge: 2019-09-19 | Disposition: A | Discharge status: Home / Self Care | Payer: BLUE CROSS/BLUE SHIELD | Source: Ambulatory Visit | Attending: Obstetrics & Gynecology | Admitting: Obstetrics & Gynecology

## 2019-09-19 VITALS — BP 152/78 | HR 98 | Resp 12 | Ht 65.0 in | Wt 168.0 lb

## 2019-09-19 DIAGNOSIS — Z87410 Personal history of cervical dysplasia: Secondary | ICD-10-CM

## 2019-09-19 DIAGNOSIS — Z113 Encounter for screening for infections with a predominantly sexual mode of transmission: Secondary | ICD-10-CM | POA: Insufficient documentation

## 2019-09-19 DIAGNOSIS — Z3141 Encounter for fertility testing: Secondary | ICD-10-CM

## 2019-09-19 DIAGNOSIS — Z01419 Encounter for gynecological examination (general) (routine) without abnormal findings: Secondary | ICD-10-CM

## 2019-09-19 DIAGNOSIS — Z124 Encounter for screening for malignant neoplasm of cervix: Secondary | ICD-10-CM | POA: Diagnosis not present

## 2019-09-19 NOTE — Patient Instructions (Addendum)
BP today was 152/78.  Values >130/80 are something we need to watch or treat.  Please check a few morning BPs and send me a message.

## 2019-09-22 IMAGING — US US THYROID
1 series · 15 of 25 positions shown · non-contrast
Comparison: 06/06/2015;

CLINICAL DATA: Prior ultrasound follow-up. History of left-sided
thyroid nodule fine-needle aspiration performed 12/15/2015.

EXAM:
THYROID ULTRASOUND
TECHNIQUE: Ultrasound examination of the thyroid gland and adjacent soft
tissues was performed.

[Series 1: us thyroid · 0.08mm/px · 15 of 56 slices shown]
[im 1/56]
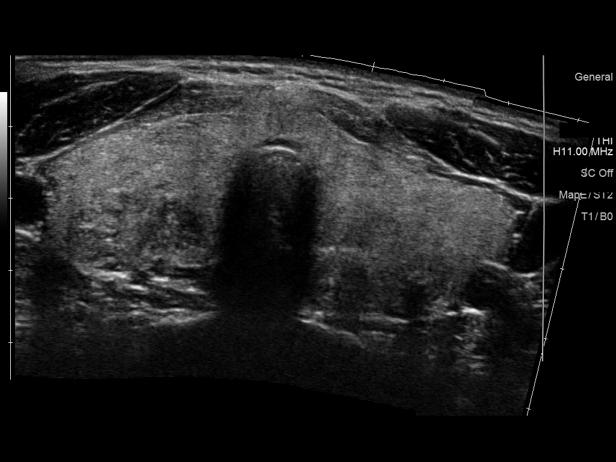
[im 5/56]
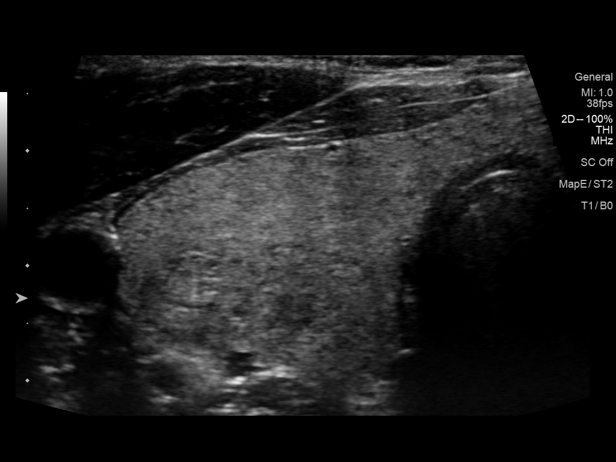
[im 10/56]
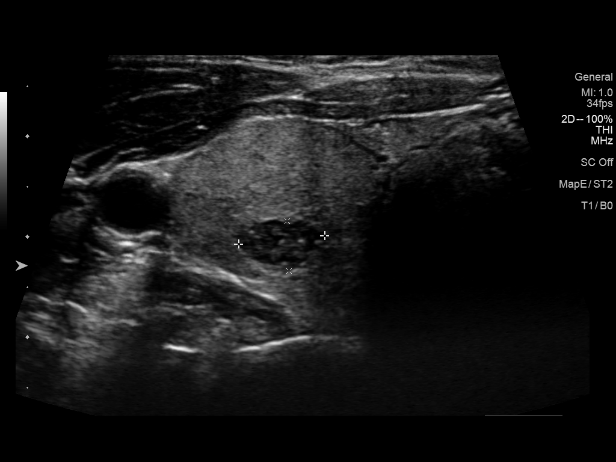
[im 12/56]
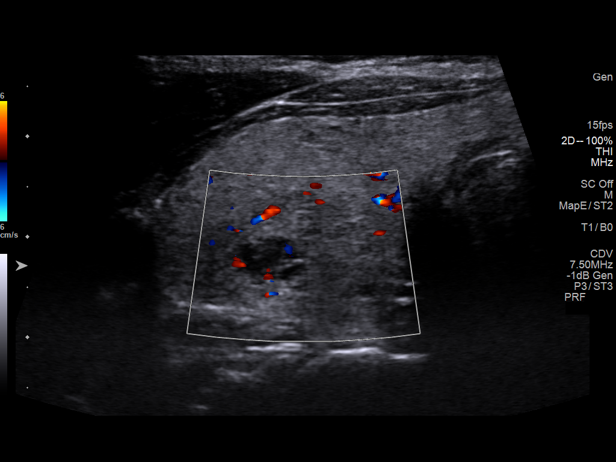
[im 17/56]
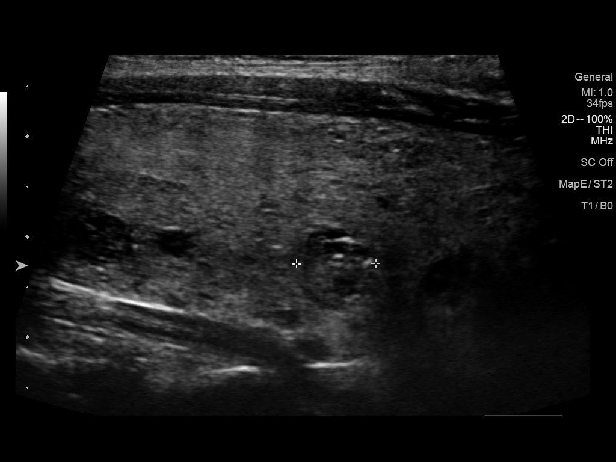
[im 21/56]
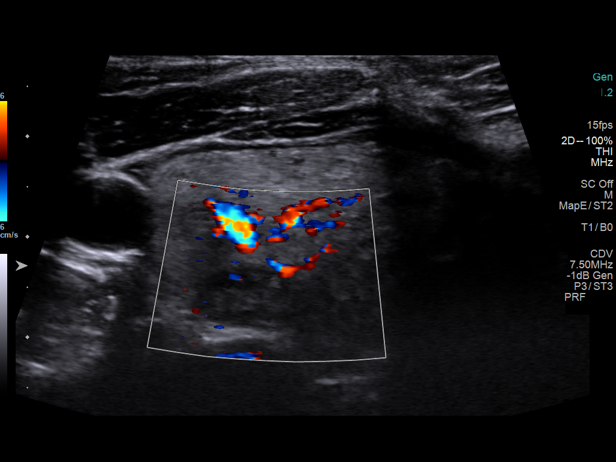
[im 23/56]
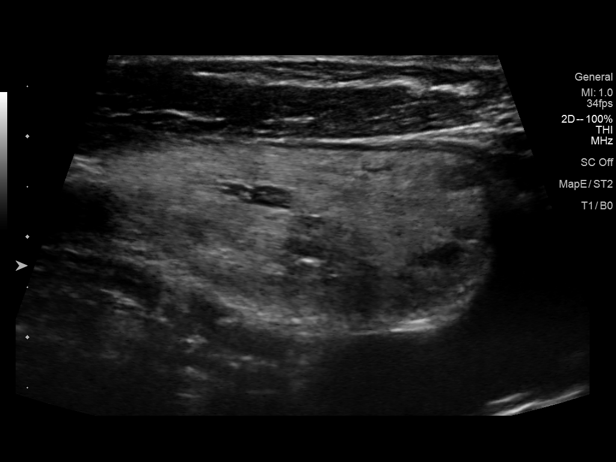
[im 28/56]
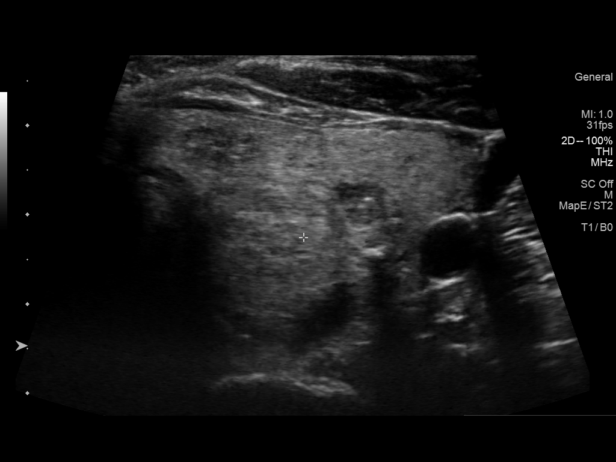
[im 33/56]
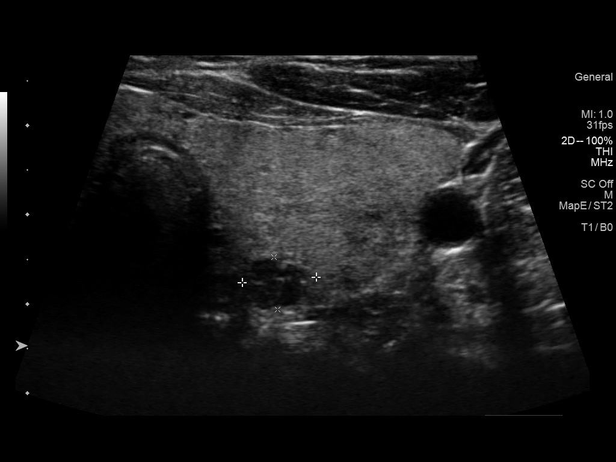
[im 35/56]
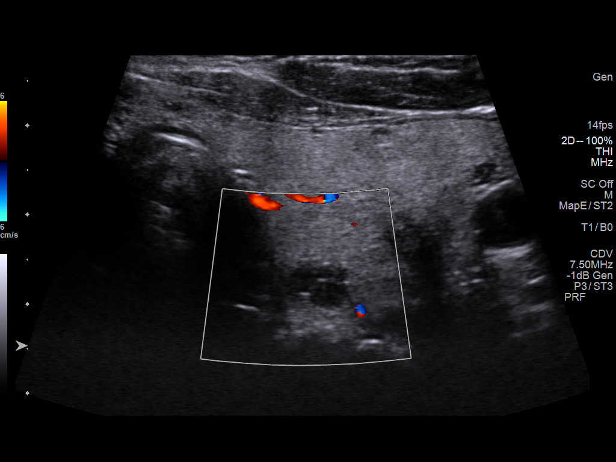
[im 39/56]
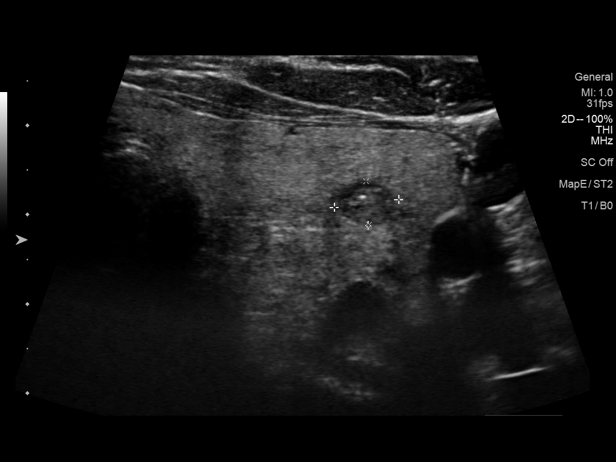
[im 44/56]
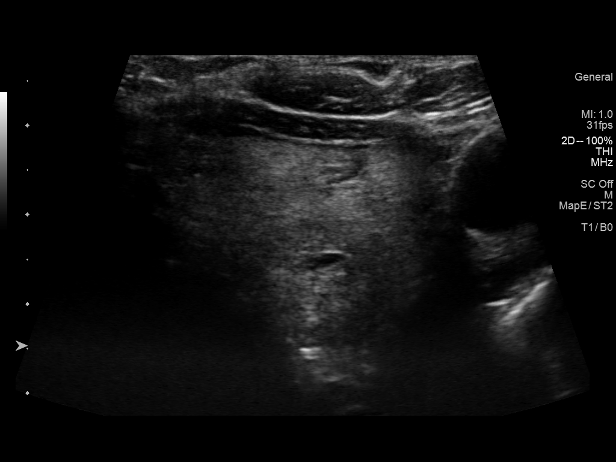
[im 46/56]
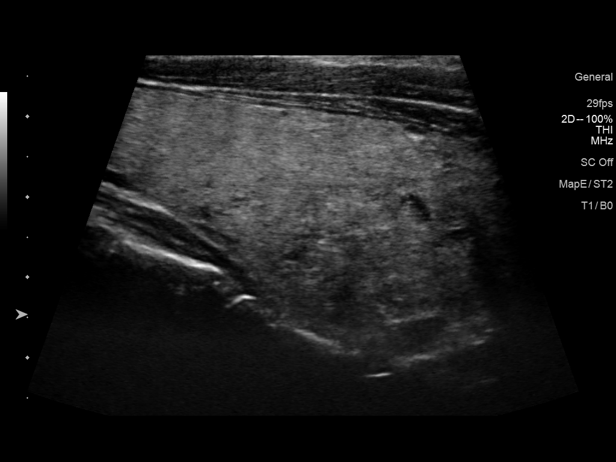
[im 51/56]
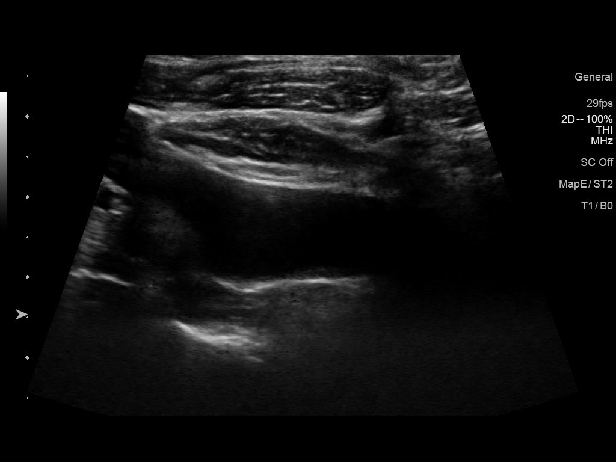
[im 56/56]
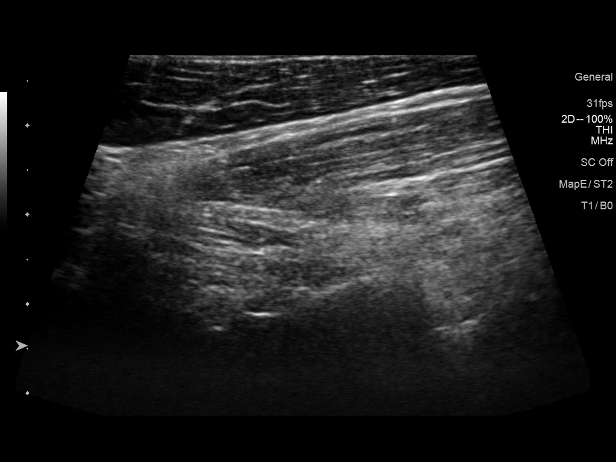

[15 of 25 positions shown; findings below may reference images not displayed]

12/09/2015; ultrasound-guided left-sided
thyroid nodule fine-needle aspiration-12/15/2015
FINDINGS: Parenchymal Echotexture: Mildly heterogenous

Isthmus: Borderline enlarged measures 0.7 cm in diameter, unchanged

Right lobe: Enlarged measuring 7.1 x 2.5 x 2.6 cm, unchanged,
previously, 6.5 x 2.3 x 2.6 cm

Left lobe: Enlarged measuring 6.6 x 3.1 x 3.2 cm, unchanged,
previously, 6.5 x 2.6 x 2.9 cm.

_________________________________________________________

Estimated total number of nodules >/= 1 cm: 4

Number of spongiform nodules >/=  2 cm not described below (TR1): 0

Number of mixed cystic and solid nodules >/= 1.5 cm not described
below (TR2): 0

_________________________________________________________

There is an approximately 0.9 x 0.9 x 0.5 cm
spongiform/benign-appearing nodule within the superior pole the
right lobe of the thyroid (labeled 1)), which has slightly increased
in size compared to the [DATE] examination, previously, 0.7 x 0.7 x
0.4 cm with slight differences attributable to interval partial
cystic degeneration, a typically benign finding.

_________________________________________________________

Nodule # 2:

Prior biopsy: No

Location: Right; Mid

Maximum size: 1.4 cm; Other 2 dimensions: 1.3 x 1.0 cm, previously,
1.3 x 1.3 x 1.0 cm (when compared to the [DATE] examination)

Composition: solid/almost completely solid (2)

Echogenicity: isoechoic (1)

Shape: not taller-than-wide (0)

Margins: ill-defined (0)

Echogenic foci: none (0)

ACR TI-RADS total points: 3.

ACR TI-RADS risk category:  TR3 (3 points).

Significant change in size (>/= 20% in two dimensions and minimal
increase of 2 mm): No - note, nodule was felt to be incorrectly
measured on the [DATE] examination secondary to the ill-defined
borders of the nodule.

Change in features: No

Change in ACR TI-RADS risk category: No

ACR TI-RADS recommendations:

Given size (<1.4 cm) and appearance, this nodule does NOT meet
TI-RADS criteria for biopsy or dedicated follow-up.

_________________________________________________________

The approximately 0.9 x 0.8 x 0.8 cm spongiform/benign-appearing
nodule within mid aspect the right lobe of the thyroid (labeled 3),
is unchanged to decreased in size compared to the [DATE]
examination, previously, 1.2 x 1.0 x 0.7 cm, again does not meet
imaging criteria to recommend percutaneous sampling or continued
dedicated follow-up.

The approximately 1.1 x 1.0 x 0.7 cm spongiform/benign-appearing
nodule within the inferior pole the right lobe of the thyroid
(labeled 4), is unchanged to decreased in size compared to the
[DATE] examination, previously, 1.0 x 0.8 x 0.8 cm and again does
not meet imaging criteria to recommend percutaneous sampling or
continued dedicated follow-up.

_________________________________________________________

_________________________________________________________

There is an approximately 0.8 x 0.7 x 0.6 cm
spongiform/benign-appearing nodule within the posterosuperior aspect
the left lobe of the thyroid (labeled 5), which does not meet
imaging criteria to recommend percutaneous sampling or continued
dedicated follow-up.

_________________________________________________________

Previously biopsied approximately 1.1 x 1.1 x 0.7 cm hypoechoic
nodule within the mid, posterior aspect the left lobe of the thyroid
(labeled 6) is again noted to contain internal echogenic
calcifications, is unchanged compared to the 12/09/2015 examination,
previously, 1.1 x 1.0 x 0.8 cm. Correlation prior biopsy results is
recommended.

_________________________________________________________

The approximately 0.9 x 0.7 x 0.5 cm hypoechoic nodule within mid
aspect of the left lobe of the thyroid (labeled 7) is again noted to
contain an internal echogenic foci ring down artifact compatible
with benign colloid and is unchanged compared to the [DATE]
examination, previously, 0.8 x 0.7 x 0.5 cm and again does not meet
imaging criteria to recommend percutaneous sampling or continued
dedicated follow-up.

_________________________________________________________

Questioned approximately 2.2 x 1.6 x 1.4 cm isoechoic ill-defined
nodule/pseudonodule within the inferior pole of the left lobe of the
thyroid (labeled 8), is unchanged to decreased in size compared to
the [DATE] examination, previously, 2.5 x 1.8 x 1.7 cm, though now
is favored to represent a pseudonodule as it lacks defined borders
on both provided transverse and sagittal images.
IMPRESSION: 1. Similar findings of multinodular goiter. No definitive worrisome
new or enlarging thyroid nodules.
2. The previously biopsied nodule within the left lobe of the
thyroid (labeled 6), is unchanged compared to the [DATE]
examination. Correlation with prior biopsy results is recommended.
Assuming a benign pathologic diagnosis, repeat sampling and/or
continued dedicated follow-up is not recommended.
3. Nodule #5 is unchanged compared to the [DATE] examination though
again meets imaging criteria to recommend a 1 year follow-up. This
examination documents 32 months of stability. Follow-up examination
[DATE] would ensure 5 years of stability and thus a benign
etiology.
4. Nodule #1 appears smaller on the present examination and no
longer meets imaging criteria to recommend continued dedicated
follow-up.
5. Nodule #8, previously meeting criteria for annual surveillance,
is now favored to represent a pseudonodule, not requiring
percutaneous sampling or continued dedicated follow-up.

The above is in keeping with the ACR TI-RADS recommendations - [HOSPITAL] 2640;[DATE].

## 2019-09-23 LAB — ANTI MULLERIAN HORMONE: ANTI-MULLERIAN HORMONE (AMH): 0.017 ng/mL

## 2019-09-24 LAB — CYTOLOGY - PAP
Adequacy: ABSENT
Chlamydia: NEGATIVE
Comment: NEGATIVE
Comment: NEGATIVE
Comment: NEGATIVE
Comment: NORMAL
Diagnosis: NEGATIVE
High risk HPV: NEGATIVE
Neisseria Gonorrhea: NEGATIVE
Trichomonas: NEGATIVE
